# Patient Record
Sex: Male | Born: 1985 | Race: White | Hispanic: No | Marital: Single | Smoking: Current every day smoker
Health system: Southern US, Community
[De-identification: ages and names within clinical notes are randomized; demographics above are authoritative.]

## PROBLEM LIST (undated history)

## (undated) ENCOUNTER — Emergency Department (HOSPITAL_COMMUNITY): Admission: EM | Payer: Medicaid Other | Source: Home / Self Care

## (undated) DIAGNOSIS — I2510 Atherosclerotic heart disease of native coronary artery without angina pectoris: Secondary | ICD-10-CM

## (undated) DIAGNOSIS — F1910 Other psychoactive substance abuse, uncomplicated: Secondary | ICD-10-CM

## (undated) DIAGNOSIS — Z951 Presence of aortocoronary bypass graft: Secondary | ICD-10-CM

## (undated) HISTORY — PX: CARDIAC SURGERY: SHX584

---

## 2009-08-30 ENCOUNTER — Emergency Department (HOSPITAL_COMMUNITY): Admission: EM | Admit: 2009-08-30 | Discharge: 2009-08-31 | Payer: Self-pay | Admitting: Emergency Medicine

## 2010-09-06 LAB — APTT: aPTT: 32 seconds (ref 24–37)

## 2010-09-06 LAB — COMPREHENSIVE METABOLIC PANEL
Albumin: 4.2 g/dL (ref 3.5–5.2)
Alkaline Phosphatase: 48 U/L (ref 39–117)
BUN: 10 mg/dL (ref 6–23)
Calcium: 9.1 mg/dL (ref 8.4–10.5)
Chloride: 106 mEq/L (ref 96–112)
Creatinine, Ser: 0.8 mg/dL (ref 0.4–1.5)
Sodium: 138 mEq/L (ref 135–145)
Total Bilirubin: 0.6 mg/dL (ref 0.3–1.2)

## 2010-09-06 LAB — CBC
MCV: 87.4 fL (ref 78.0–100.0)
RBC: 5.03 MIL/uL (ref 4.22–5.81)
RDW: 11.4 % — ABNORMAL LOW (ref 11.5–15.5)
WBC: 8.4 10*3/uL (ref 4.0–10.5)

## 2010-09-06 LAB — POCT CARDIAC MARKERS
CKMB, poc: <1 ng/mL — ABNORMAL LOW (ref 1.0–8.0)
Myoglobin, poc: 50.7 ng/mL (ref 12–200)
Troponin i, poc: <0.05 ng/mL (ref 0.00–0.09)

## 2010-09-06 LAB — DIFFERENTIAL
Basophils Absolute: 0.1 10*3/uL (ref 0.0–0.1)
Eosinophils Absolute: 0.1 10*3/uL (ref 0.0–0.7)
Neutro Abs: 5.1 10*3/uL (ref 1.7–7.7)

## 2010-09-06 LAB — D-DIMER, QUANTITATIVE: D-Dimer, Quant: <0.22 ug/mL-FEU (ref 0.00–0.48)

## 2010-09-06 LAB — BRAIN NATRIURETIC PEPTIDE: Pro B Natriuretic peptide (BNP): <30 pg/mL (ref 0.0–100.0)

## 2015-10-07 ENCOUNTER — Encounter: Payer: Self-pay | Admitting: Internal Medicine

## 2015-12-02 ENCOUNTER — Ambulatory Visit: Payer: Self-pay | Admitting: Gastroenterology

## 2015-12-03 ENCOUNTER — Ambulatory Visit: Payer: Self-pay | Admitting: Internal Medicine

## 2016-03-02 ENCOUNTER — Encounter (HOSPITAL_COMMUNITY): Payer: Self-pay | Admitting: *Deleted

## 2016-03-02 ENCOUNTER — Emergency Department (HOSPITAL_COMMUNITY)
Admission: EM | Admit: 2016-03-02 | Discharge: 2016-03-02 | Disposition: A | Discharge status: Home / Self Care | Payer: Self-pay | Attending: Emergency Medicine | Admitting: Emergency Medicine

## 2016-03-02 DIAGNOSIS — Y929 Unspecified place or not applicable: Secondary | ICD-10-CM | POA: Insufficient documentation

## 2016-03-02 DIAGNOSIS — S0531XA Ocular laceration without prolapse or loss of intraocular tissue, right eye, initial encounter: Secondary | ICD-10-CM | POA: Insufficient documentation

## 2016-03-02 DIAGNOSIS — W2107XA Struck by softball, initial encounter: Secondary | ICD-10-CM | POA: Insufficient documentation

## 2016-03-02 DIAGNOSIS — Y9364 Activity, baseball: Secondary | ICD-10-CM | POA: Insufficient documentation

## 2016-03-02 DIAGNOSIS — Y999 Unspecified external cause status: Secondary | ICD-10-CM | POA: Insufficient documentation

## 2016-03-02 DIAGNOSIS — Z5321 Procedure and treatment not carried out due to patient leaving prior to being seen by health care provider: Secondary | ICD-10-CM | POA: Insufficient documentation

## 2016-03-02 NOTE — ED Notes (Signed)
Pt states that he has to go to work in an hour, pt states he is unable to stay to be seen by a doctor. Laceration to eye not bleeding at this time.

## 2016-03-02 NOTE — ED Triage Notes (Signed)
Pt was playing softball around 2200 when he a softball ricochet off the bat hitting his right eye. Pt states the ball hit his glasses first then his eye. Pt presents with a laceration above right eye, below right eye brow. Pt denies LOC.

## 2018-01-31 ENCOUNTER — Encounter (HOSPITAL_COMMUNITY): Payer: Self-pay | Admitting: Emergency Medicine

## 2018-01-31 ENCOUNTER — Emergency Department (HOSPITAL_COMMUNITY)
Admission: EM | Admit: 2018-01-31 | Discharge: 2018-01-31 | Disposition: A | Discharge status: Other Acute Inpatient Hospital | Payer: Medicaid Other | Attending: Emergency Medicine | Admitting: Emergency Medicine

## 2018-01-31 ENCOUNTER — Encounter (HOSPITAL_COMMUNITY): Payer: Self-pay

## 2018-01-31 ENCOUNTER — Inpatient Hospital Stay (HOSPITAL_COMMUNITY)
Admission: AD | Admit: 2018-01-31 | Discharge: 2018-02-03 | DRG: 897 | Disposition: A | Discharge status: Home / Self Care | Payer: Medicaid Other | Source: Intra-hospital | Attending: Psychiatry | Admitting: Psychiatry

## 2018-01-31 ENCOUNTER — Other Ambulatory Visit: Payer: Self-pay

## 2018-01-31 DIAGNOSIS — F329 Major depressive disorder, single episode, unspecified: Secondary | ICD-10-CM | POA: Diagnosis present

## 2018-01-31 DIAGNOSIS — F1920 Other psychoactive substance dependence, uncomplicated: Secondary | ICD-10-CM | POA: Diagnosis not present

## 2018-01-31 DIAGNOSIS — F1123 Opioid dependence with withdrawal: Secondary | ICD-10-CM | POA: Diagnosis present

## 2018-01-31 DIAGNOSIS — Z046 Encounter for general psychiatric examination, requested by authority: Secondary | ICD-10-CM | POA: Insufficient documentation

## 2018-01-31 DIAGNOSIS — F332 Major depressive disorder, recurrent severe without psychotic features: Secondary | ICD-10-CM | POA: Diagnosis present

## 2018-01-31 DIAGNOSIS — I2510 Atherosclerotic heart disease of native coronary artery without angina pectoris: Secondary | ICD-10-CM | POA: Diagnosis present

## 2018-01-31 DIAGNOSIS — F10230 Alcohol dependence with withdrawal, uncomplicated: Secondary | ICD-10-CM | POA: Diagnosis present

## 2018-01-31 DIAGNOSIS — F1120 Opioid dependence, uncomplicated: Secondary | ICD-10-CM

## 2018-01-31 DIAGNOSIS — Z951 Presence of aortocoronary bypass graft: Secondary | ICD-10-CM

## 2018-01-31 DIAGNOSIS — G4700 Insomnia, unspecified: Secondary | ICD-10-CM | POA: Diagnosis present

## 2018-01-31 DIAGNOSIS — F1124 Opioid dependence with opioid-induced mood disorder: Secondary | ICD-10-CM | POA: Diagnosis present

## 2018-01-31 DIAGNOSIS — F1910 Other psychoactive substance abuse, uncomplicated: Secondary | ICD-10-CM | POA: Diagnosis not present

## 2018-01-31 DIAGNOSIS — F1024 Alcohol dependence with alcohol-induced mood disorder: Secondary | ICD-10-CM | POA: Diagnosis present

## 2018-01-31 DIAGNOSIS — F419 Anxiety disorder, unspecified: Secondary | ICD-10-CM | POA: Diagnosis not present

## 2018-01-31 DIAGNOSIS — F17220 Nicotine dependence, chewing tobacco, uncomplicated: Secondary | ICD-10-CM | POA: Insufficient documentation

## 2018-01-31 DIAGNOSIS — Z590 Homelessness: Secondary | ICD-10-CM | POA: Diagnosis not present

## 2018-01-31 DIAGNOSIS — F32A Depression, unspecified: Secondary | ICD-10-CM

## 2018-01-31 DIAGNOSIS — I252 Old myocardial infarction: Secondary | ICD-10-CM | POA: Diagnosis not present

## 2018-01-31 DIAGNOSIS — F17210 Nicotine dependence, cigarettes, uncomplicated: Secondary | ICD-10-CM | POA: Diagnosis present

## 2018-01-31 HISTORY — DX: Atherosclerotic heart disease of native coronary artery without angina pectoris: I25.10

## 2018-01-31 LAB — CBC
HCT: 47.6 % (ref 39.0–52.0)
Hemoglobin: 15.4 g/dL (ref 13.0–17.0)
MCH: 29.1 pg (ref 26.0–34.0)
MCHC: 32.4 g/dL (ref 30.0–36.0)
MCV: 90 fL (ref 78.0–100.0)
PLATELETS: 302 10*3/uL (ref 150–400)
RBC: 5.29 MIL/uL (ref 4.22–5.81)
RDW: 11.9 % (ref 11.5–15.5)
WBC: 9.3 10*3/uL (ref 4.0–10.5)

## 2018-01-31 LAB — COMPREHENSIVE METABOLIC PANEL
ALBUMIN: 4.5 g/dL (ref 3.5–5.0)
ALK PHOS: 47 U/L (ref 38–126)
ALT: 20 U/L (ref 0–44)
ANION GAP: 8 (ref 5–15)
AST: 19 U/L (ref 15–41)
BILIRUBIN TOTAL: 0.7 mg/dL (ref 0.3–1.2)
BUN: 11 mg/dL (ref 6–20)
CALCIUM: 9.8 mg/dL (ref 8.9–10.3)
CO2: 30 mmol/L (ref 22–32)
Chloride: 103 mmol/L (ref 98–111)
Creatinine, Ser: 0.91 mg/dL (ref 0.61–1.24)
GFR calc Af Amer: >60 mL/min (ref >60)
GFR calc non Af Amer: >60 mL/min (ref >60)
Glucose, Bld: 103 mg/dL — ABNORMAL HIGH (ref 70–99)
POTASSIUM: 4.1 mmol/L (ref 3.5–5.1)
SODIUM: 141 mmol/L (ref 135–145)
TOTAL PROTEIN: 7.3 g/dL (ref 6.5–8.1)

## 2018-01-31 LAB — RAPID URINE DRUG SCREEN, HOSP PERFORMED
AMPHETAMINES: NOT DETECTED
BENZODIAZEPINES: NOT DETECTED
Barbiturates: NOT DETECTED
COCAINE: NOT DETECTED
Opiates: POSITIVE — AB
TETRAHYDROCANNABINOL: POSITIVE — AB

## 2018-01-31 LAB — ETHANOL: Alcohol, Ethyl (B): <10 mg/dL (ref <10)

## 2018-01-31 MED ORDER — VITAMIN B-1 100 MG PO TABS
100.0000 mg | ORAL_TABLET | Freq: Every day | ORAL | Status: DC
  Administered 2018-02-01 – 2018-02-02 (×2): 100 mg via ORAL
  Filled 2018-01-31 (×6): qty 1

## 2018-01-31 MED ORDER — METOPROLOL TARTRATE 25 MG PO TABS
37.5000 mg | ORAL_TABLET | Freq: Two times a day (BID) | ORAL | Status: DC
  Administered 2018-01-31: 37.5 mg via ORAL
  Filled 2018-01-31: qty 2

## 2018-01-31 MED ORDER — THIAMINE HCL 100 MG/ML IJ SOLN: 100.0000 mg | Freq: Once | INTRAMUSCULAR | Status: DC

## 2018-01-31 MED ORDER — ONDANSETRON 4 MG PO TBDP: 4.0000 mg | ORAL_TABLET | Freq: Four times a day (QID) | ORAL | Status: DC | PRN

## 2018-01-31 MED ORDER — LORAZEPAM 1 MG PO TABS
1.0000 mg | ORAL_TABLET | Freq: Four times a day (QID) | ORAL | Status: DC | PRN
  Administered 2018-01-31 – 2018-02-01 (×3): 1 mg via ORAL
  Filled 2018-01-31 (×3): qty 1

## 2018-01-31 MED ORDER — METOPROLOL TARTRATE 25 MG PO TABS
37.5000 mg | ORAL_TABLET | ORAL | Status: DC
  Administered 2018-02-01 – 2018-02-03 (×5): 37.5 mg via ORAL
  Filled 2018-01-31 (×11): qty 1

## 2018-01-31 MED ORDER — IBUPROFEN 400 MG PO TABS: 400.0000 mg | ORAL_TABLET | Freq: Four times a day (QID) | ORAL | Status: DC | PRN

## 2018-01-31 MED ORDER — ATORVASTATIN CALCIUM 40 MG PO TABS
40.0000 mg | ORAL_TABLET | Freq: Every day | ORAL | Status: DC
  Administered 2018-01-31: 40 mg via ORAL
  Filled 2018-01-31: qty 1

## 2018-01-31 MED ORDER — LOPERAMIDE HCL 2 MG PO CAPS: 2.0000 mg | ORAL_CAPSULE | ORAL | Status: DC | PRN

## 2018-01-31 MED ORDER — IBUPROFEN 400 MG PO TABS
400.0000 mg | ORAL_TABLET | Freq: Four times a day (QID) | ORAL | Status: DC | PRN
  Administered 2018-02-01: 400 mg via ORAL
  Filled 2018-01-31: qty 1

## 2018-01-31 MED ORDER — ADULT MULTIVITAMIN W/MINERALS CH
1.0000 | ORAL_TABLET | Freq: Every day | ORAL | Status: DC
  Administered 2018-02-01 – 2018-02-02 (×2): 1 via ORAL
  Filled 2018-01-31 (×7): qty 1

## 2018-01-31 MED ORDER — ACETAMINOPHEN 500 MG PO TABS: 500.0000 mg | ORAL_TABLET | Freq: Four times a day (QID) | ORAL | Status: DC | PRN

## 2018-01-31 MED ORDER — MAGNESIUM HYDROXIDE 400 MG/5ML PO SUSP: 30.0000 mL | Freq: Every day | ORAL | Status: DC | PRN

## 2018-01-31 MED ORDER — NICOTINE 21 MG/24HR TD PT24
21.0000 mg | MEDICATED_PATCH | Freq: Every day | TRANSDERMAL | Status: DC
  Administered 2018-01-31 – 2018-02-02 (×3): 21 mg via TRANSDERMAL
  Filled 2018-01-31 (×7): qty 1

## 2018-01-31 MED ORDER — SERTRALINE HCL 50 MG PO TABS
50.0000 mg | ORAL_TABLET | Freq: Every day | ORAL | Status: DC
  Administered 2018-01-31: 50 mg via ORAL
  Filled 2018-01-31: qty 1

## 2018-01-31 MED ORDER — LORAZEPAM 1 MG PO TABS
1.0000 mg | ORAL_TABLET | Freq: Once | ORAL | Status: AC
  Administered 2018-01-31: 1 mg via ORAL
  Filled 2018-01-31: qty 1

## 2018-01-31 MED ORDER — NITROGLYCERIN 0.4 MG SL SUBL: 0.4000 mg | SUBLINGUAL_TABLET | SUBLINGUAL | Status: DC | PRN

## 2018-01-31 MED ORDER — ACETAMINOPHEN 325 MG PO TABS: 650.0000 mg | ORAL_TABLET | Freq: Four times a day (QID) | ORAL | Status: DC | PRN

## 2018-01-31 MED ORDER — HYDROXYZINE HCL 25 MG PO TABS
25.0000 mg | ORAL_TABLET | Freq: Four times a day (QID) | ORAL | Status: DC | PRN
  Administered 2018-01-31 (×2): 25 mg via ORAL
  Filled 2018-01-31 (×2): qty 1

## 2018-01-31 MED ORDER — ASPIRIN EC 325 MG PO TBEC
325.0000 mg | DELAYED_RELEASE_TABLET | Freq: Every day | ORAL | Status: DC
  Administered 2018-01-31: 325 mg via ORAL
  Filled 2018-01-31: qty 1

## 2018-01-31 MED ORDER — ASPIRIN 325 MG PO TABS
325.0000 mg | ORAL_TABLET | Freq: Every day | ORAL | Status: DC
  Administered 2018-02-01 – 2018-02-02 (×2): 325 mg via ORAL
  Filled 2018-01-31 (×7): qty 1

## 2018-01-31 MED ORDER — ALUM & MAG HYDROXIDE-SIMETH 200-200-20 MG/5ML PO SUSP: 30.0000 mL | ORAL | Status: DC | PRN

## 2018-01-31 MED ORDER — ATORVASTATIN CALCIUM 40 MG PO TABS
40.0000 mg | ORAL_TABLET | Freq: Every day | ORAL | Status: DC
  Administered 2018-02-01 – 2018-02-03 (×3): 40 mg via ORAL
  Filled 2018-01-31 (×6): qty 1

## 2018-01-31 NOTE — Progress Notes (Signed)
Patient attended NA meeting and participated.  

## 2018-01-31 NOTE — ED Notes (Signed)
Breakfast tray ordered 

## 2018-01-31 NOTE — ED Notes (Signed)
Pt to have inpatient treatment. Pt made aware and is extremely happy with the plan.

## 2018-01-31 NOTE — ED Notes (Signed)
Patient given blue paper scrubs at triage /security notified to wand pt.

## 2018-01-31 NOTE — ED Notes (Signed)
[facility]. All belongings given to [facility] trans port. Pt stable with steady gait.

## 2018-01-31 NOTE — ED Notes (Addendum)
Pt states anxiety improved. Request to wear Underarmor shirt un derscrubs as comfort for recent surgery. Same done after discussed with PA

## 2018-01-31 NOTE — ED Notes (Signed)
TTS in progress 

## 2018-01-31 NOTE — BH Assessment (Signed)
Tele Assessment Note   Patient Name: Derek Little MRN: [id number] Referring Physician: Dr. Frederick Peers, MD Location of Patient: [facility] Location of Provider: [facility]  Derek Little is a 32 y.o. male who presents to the [facility] depressed asking for assistance getting off of heroin. Pt shares he had a heart attack approximately two weeks ago he was placed on medication that was very similar to medication. He states that he became extremely depressed while on the medication, resulting on him drinking excessively to fight the depression, which then led to him using the heroin when he was unable to get more prescription percocet.  Pt shares he has been feeling that he "can't go on" and that if he doesn't stop, he doesn't know what he'll do. Pt denies a plan at this time. He denies AVH, HI, or NSSIB. He denies any invent or seeing a therapist or a psychiatrist. Pt denies having a therapist or psychiatrist in the past or presently.  Pt denies any history of familial abuse. He shares his grandparents may have had alcoholism. He denies access to weapons. Pt shares his grandparents may have his guns in their home for safety. Pt states his legs have been jerking in his sleep. He states his appetite has been almost non-existent.   Pt is oriented x4. His remote and recent memory is intact. Pt is cooperative, yet tearful, throughout the assessment. Pt's insight, judgement, and impulse control is impaired at this time.  Derek Conn NP reviewed pt's chart and information and determined that pt meets criteria for inpatient hospitalization. Pts placement is pending at this time.   Diagnosis: F33.2, Major depressive disorder, Recurrent episode, Severe   Past Medical History:  Past Medical History:  Diagnosis Date  . CAD (coronary artery disease)     Past Surgical History:  Procedure Laterality Date  . CARDIAC SURGERY      Family History: No family history on  file.  Social History:  reports that he has been smoking. He has quit using smokeless tobacco.  His smokeless tobacco use included snuff. He reports that he drinks alcohol. He reports that he has current or past drug history. Drug: Marijuana.  Additional Social History:  Alcohol / Drug Use Pain Medications: Please see MAR Prescriptions: Please see MAR Over the Counter: Please see MAR History of alcohol / drug use?: Yes Longest period of sobriety (when/how long): 10 years (almost 11 years) Substance #1 Name of Substance 1: Heroin 1 - Age of First Use: 13 1 - Amount (size/oz): Unknown (states uses "5-10 bags" daily) 1 - Frequency: Daily 1 - Duration: 4-5 weeks 1 - Last Use / Amount: Yesterday (01/30/18) Substance #2 Name of Substance 2: EtOH 2 - Age of First Use: Unknown 2 - Amount (size/oz): 1/4 gallon 2 - Frequency: Daily 2 - Duration: Unknown 2 - Last Use / Amount: Yesterday (01/30/09)  CIWA: CIWA-Ar BP: 116/88 Pulse Rate: 83 COWS: Clinical Opiate Withdrawal Scale (COWS) Resting Pulse Rate: Pulse Rate 80 or below Sweating: No report of chills or flushing Restlessness: Able to sit still Pupil Size: Pupils pinned or normal size for room light Bone or Joint Aches: Not present Runny Nose or Tearing: Not present GI Upset: No GI symptoms Tremor: No tremor Yawning: No yawning Anxiety or Irritability: Patient reports increasing irritability or anxiousness Gooseflesh Skin: Skin is smooth COWS Total Score: 1  Allergies: No Known Allergies  Home Medications:  (Not in a hospital admission)  OB/GYN Status:  No LMP  for male patient.  General Assessment Data [facility] Assessment: In system Is this a Tele or Face-to-Face Assessment?: Tele Assessment Is this an Initial Assessment or a Re-assessment for this encounter?: Initial Assessment Marital status: Other (comment)(Pt & his fiance may have split up, he's not sure) Derek Little Is patient  pregnant?: No Pregnancy Status: No Living Arrangements: Alone Can pt return to current living arrangement?: Yes Admission Status: Voluntary Is patient capable of signing voluntary admission?: Yes Referral Source: Self/Family/Friend Insurance type: Medicaid     Crisis Care Plan Living Arrangements: Alone Legal Guardian: (N/A) Name of Psychiatrist: None Name of Therapist: None  Education Status Is patient currently in school?: No Is the patient employed, unemployed or receiving disability?: Unemployed  Risk to self with the past 6 months Suicidal Ideation: Yes-Currently Present Has patient been a risk to self within the past 6 months prior to admission? : Yes Suicidal Intent: No Has patient had any suicidal intent within the past 6 months prior to admission? : No Is patient at risk for suicide?: Yes Suicidal Plan?: No Has patient had any suicidal plan within the past 6 months prior to admission? : No Access to Means: No What has been your use of drugs/alcohol within the last 12 months?: Pt has been using heroin and EtOH Previous Attempts/Gestures: No How many times?: 0 Other Self Harm Risks: Pt states he "can't do this anymore" Triggers for Past Attempts: None known Intentional Self Injurious Behavior: None Family Suicide History: No Recent stressful life event(s): Conflict (Comment), Turmoil (Comment), Loss (Comment)(Pt relapsed, had heart attach, lost home due to lack of work) Persecutory voices/beliefs?: No Depression: Yes Depression Symptoms: Insomnia, Tearfulness, Fatigue, Guilt, Loss of interest in usual pleasures, Feeling worthless/self pity, Despondent Substance abuse history and/or treatment for substance abuse?: Yes Suicide prevention information given to non-admitted patients: Not applicable  Risk to Others within the past 6 months Homicidal Ideation: No Does patient have any lifetime risk of violence toward others beyond the six months prior to admission? :  No Thoughts of Harm to Others: No Current Homicidal Intent: No Current Homicidal Plan: No Access to Homicidal Means: No Identified Victim: None noted History of harm to others?: No Assessment of Violence: On admission Violent Behavior Description: None noted Does patient have access to weapons?: No(Pt denied)  Psychosis Hallucinations: None noted Delusions: None noted  Mental Status Report Appearance/Hygiene: In scrubs Eye Contact: Fair Motor Activity: (Pt was sitting in bed) Speech: Logical/coherent Level of Consciousness: Crying, Alert Mood: Depressed, Worthless, low self-esteem Affect: Sad, Sullen, Depressed Anxiety Level: Moderate Thought Processes: Coherent, Relevant Judgement: Unimpaired Orientation: Person, Place, Time, Situation Obsessive Compulsive Thoughts/Behaviors: None  Cognitive Functioning Concentration: Normal Memory: Recent Intact, Remote Intact Is patient IDD: No Is patient DD?: No Insight: Fair Impulse Control: Poor Appetite: Poor Have you had any weight changes? : No Change Sleep: Decreased Total Hours of Sleep: 2 Vegetative Symptoms: None  ADLScreening [facility] Assessment Services) Patient's cognitive ability adequate to safely complete daily activities?: Yes Patient able to express need for assistance with ADLs?: Yes Independently performs ADLs?: Yes (appropriate for developmental age)  Prior Inpatient Therapy Prior Inpatient Therapy: No  Prior Outpatient Therapy Prior Outpatient Therapy: No Does patient have an ACCT team?: No Does patient have Intensive In-House Services?  : No Does patient have [facility] services? : No Does patient have P4CC services?: No  ADL Screening (condition at time of admission) Patient's cognitive ability adequate to safely complete daily activities?: Yes Is  the patient deaf or have difficulty hearing?: No Does the patient have difficulty seeing, even when wearing glasses/contacts?: No Does the patient have  difficulty concentrating, remembering, or making decisions?: No Patient able to express need for assistance with ADLs?: Yes Does the patient have difficulty dressing or bathing?: No Independently performs ADLs?: Yes (appropriate for developmental age) Does the patient have difficulty walking or climbing stairs?: No Weakness of Legs: None Weakness of Arms/Hands: None     Therapy Consults (therapy consults require a physician order) PT Evaluation Needed: No OT Evalulation Needed: No SLP Evaluation Needed: No Abuse/Neglect Assessment (Assessment to be complete while patient is alone) Abuse/Neglect Assessment Can Be Completed: Yes Physical Abuse: Denies Verbal Abuse: Denies Sexual Abuse: Denies Exploitation of patient/patient's resources: Denies Self-Neglect: Denies Values / Beliefs Cultural Requests During Hospitalization: None Spiritual Requests During Hospitalization: None Consults Spiritual Care Consult Needed: No Social Work Consult Needed: No Merchant navy officerAdvance Directives (For Healthcare) Does Patient Have a Medical Advance Directive?: No Would patient like information on creating a medical advance directive?: No - Patient declined       Disposition: Derek ConnJason Berry NP reviewed pt's chart and information and determined that pt meets criteria for inpatient hospitalization. Pts placement is pending at this time. Disposition Initial Assessment Completed for this Encounter: Yes Patient referred to: Other (Comment)([facility])  This service was provided via telemedicine using a 2-way, interactive audio and video technology.  Names of all persons participating in this telemedicine service and their role in this encounter. Name: Derek Little Role: Patient  Name: Duard BradySamantha Sohan Potvin Role: Clinician    Ralph DowdySamantha L Marsalis Beaulieu 01/31/2018 6:24 AM

## 2018-01-31 NOTE — ED Notes (Signed)
Lunch tray at bedsdie. Pt declines to eat at this time. Offered to warm plate. Declined

## 2018-01-31 NOTE — ED Provider Notes (Signed)
11:08 AM Behavioral health team called to report that patient is going to be admitted for further inpatient management.  Patient was reassessed by me and he had no physical complaints.   Based on his lack of other complaints and work-up, I feel he is medically cleared for psychiatric management.   He will be started back on his home medication regimen and likely admitted this afternoon.   Lizzete Gough, Canary Brimhristopher J, MD 01/31/18 [phone]

## 2018-01-31 NOTE — Tx Team (Signed)
Initial Treatment Plan 01/31/2018 4:03 PM Derek NakayamaMichael Menta [id number]    PATIENT STRESSORS: Financial difficulties Health problems Substance abuse   PATIENT STRENGTHS: Communication skills General fund of knowledge Supportive family/friends   PATIENT IDENTIFIED PROBLEMS: Depression  Substance abuse  "Get of these drugs"                 DISCHARGE CRITERIA:  Improved stabilization in mood, thinking, and/or behavior Withdrawal symptoms are absent or subacute and managed without 24-hour nursing intervention  PRELIMINARY DISCHARGE PLAN: Outpatient therapy Medication management  PATIENT/FAMILY INVOLVEMENT: This treatment plan has been presented to and reviewed with the patient, Derek Little.  The patient and family have been given the opportunity to ask questions and make suggestions.  Levin BaconHeather V Malosi Hemstreet, RN 01/31/2018, 4:03 PM

## 2018-01-31 NOTE — ED Notes (Signed)
Pt intermittently tearful but redirects with information about process. Pt statyes he feels like he is "all over the place" but declines further medication.

## 2018-01-31 NOTE — ED Provider Notes (Signed)
[facility] Provider Note   CSN: [id number] Arrival date & time: 01/31/18  0007     History   Chief Complaint Chief Complaint  Patient presents with  . Addiction Problem    HPI Melrose NakayamaMichael Isham is a 32 y.o. male.  Patient presents for help with addiction. He used heroin from the age of 32 until 20 years and was clean for 10 years. He reports heart surgery recently (CABG) and being put on percocet post-op led to relapse into heavy alcohol use and heroin. He states he feels he has no options and is desperate for help. He denies SI but is very tearful and states "I can't live like this". No HI/AVH.   The history is provided by the patient. No language interpreter was used.    Past Medical History:  Diagnosis Date  . CAD (coronary artery disease)     There are no active problems to display for this patient.   Past Surgical History:  Procedure Laterality Date  . CARDIAC SURGERY          Home Medications    Prior to Admission medications   Not on File    Family History No family history on file.  Social History Social History   Tobacco Use  . Smoking status: Current Every Day Smoker  . Smokeless tobacco: Former NeurosurgeonUser    Types: Snuff  Substance Use Topics  . Alcohol use: Yes  . Drug use: Yes    Types: Marijuana    Comment: Heroin /Percocet     Allergies   Patient has no known allergies.   Review of Systems Review of Systems  Constitutional: Negative for chills and fever.  HENT: Negative.   Respiratory: Negative.  Negative for shortness of breath.   Cardiovascular: Negative.  Negative for chest pain.  Gastrointestinal: Negative.   Musculoskeletal: Negative.   Skin: Negative.   Neurological: Negative.   Psychiatric/Behavioral: Positive for dysphoric mood. The patient is nervous/anxious.        See HPI.     Physical Exam Updated Vital Signs BP 116/88 (BP Location: Right Arm)   Pulse 83   Temp 98.6 F (37  C) (Oral)   Resp 16   SpO2 100%   Physical Exam  Constitutional: He is oriented to person, place, and time. He appears well-developed and well-nourished.  HENT:  Head: Normocephalic.  Neck: Normal range of motion. Neck supple.  Cardiovascular: Normal rate and regular rhythm.  Pulmonary/Chest: Effort normal and breath sounds normal. He has no wheezes. He has no rales.  Well healed surgical scar anterior chest.   Abdominal: Soft. Bowel sounds are normal. There is no tenderness. There is no rebound and no guarding.  Musculoskeletal: Normal range of motion.  Neurological: He is alert and oriented to person, place, and time.  Skin: Skin is warm and dry. No rash noted.  Psychiatric: His behavior is normal. His mood appears anxious. He is not actively hallucinating. He exhibits a depressed mood.  Patient is tearful, anxious.      ED Treatments / Results  Labs (all labs ordered are listed, but only abnormal results are displayed) Labs Reviewed  COMPREHENSIVE METABOLIC PANEL - Abnormal; Notable for the following components:      Result Value   Glucose, Bld 103 (*)    All other components within normal limits  ETHANOL  CBC  RAPID URINE DRUG SCREEN, HOSP PERFORMED    EKG None  Radiology No results found.  Procedures Procedures (  including critical care time)  Medications Ordered in ED Medications - No data to display   Initial Impression / Assessment and Plan / ED Course  I have reviewed the triage vital signs and the nursing notes.  Pertinent labs & imaging results that were available during my care of the patient were reviewed by me and considered in my medical decision making (see chart for details).     Patient with a history of heroin abuse many years ago, clean for 10 years then relapsed after recent heart surgery and use of Percocet for post-op pain. He reports heavy alcohol use as well, up to a 5th a day. No history of withdrawal seizures or DT's.   TTS  consultation requested as patient is depressed, making soft suicidal statements "I can't like like this".   Final Clinical Impressions(s) / ED Diagnoses   Final diagnoses:  None   1. Polysubstance abuse 2. Depression   ED Discharge Orders    None       Elpidio AnisUpstill, Yisel Megill, Cordelia Poche-C 01/31/18 0242    Little, Ambrose Finlandachel Morgan, MD 01/31/18 [phone]

## 2018-01-31 NOTE — Discharge Instructions (Signed)
Transfer to [facility] °

## 2018-01-31 NOTE — ED Notes (Addendum)
Patient denies pain, but is very stressed that he won't get help for the heroin. States that he had heart surgery and was started on percocet which lead to heroin.  States that he has been doing it for 3 weeks. Pt in tears. Sandwich bag and drink given to patient. Pt states that he cannot live like this. Speaking with pt until MD comes to bedside.

## 2018-01-31 NOTE — ED Notes (Signed)
[facility] contacted fro transport. [facility] contacted and most recent vs and update given.

## 2018-01-31 NOTE — ED Notes (Signed)
Pt's belongings in locker #2, not yet inventoried.

## 2018-01-31 NOTE — ED Notes (Addendum)
Pt requesting medication for anxiety. Dr. Rush Landmarkegeler aware.

## 2018-01-31 NOTE — Progress Notes (Signed)
Derek Little is a 32 year old male being admitted voluntarily to 306-2 from [facility].  He came in for help with his heroin addiction.  He reported increasing depression, drinking and using heroin when he couldn't get refill of Percocet.  He was recently had a heart attack and has diagnoses of coronary artery disease.  [facility], he was pleasant and cooperative.  He denied SI/HI or A/V hallucinations.  He stated that his recent alcohol/substance abuse, open heart surgery and inability to work are his stressors.  Oriented him to the unit.  Admission paperwork completed and signed.  Belongings searched and secured in locker # 46, no contraband found.  Skin assessment completed and noted midline scar on chest from recent open heart surgery.  Q 15 minute checks initiated for safety.  We will continue to monitor the progress towards his goals.

## 2018-01-31 NOTE — ED Notes (Addendum)
Pt talking on phone with family after phone numbers retrieved fromn belongings.

## 2018-01-31 NOTE — ED Triage Notes (Signed)
Patient requesting psychiatric treatment/Detox for his Heroin addiction and anxiety , denies suicidal ideation / no hallucinations.

## 2018-01-31 NOTE — ED Notes (Signed)
ED Provider at bedside. 

## 2018-01-31 NOTE — Progress Notes (Signed)
[facility] Bed [facility] Greer EeArchna Kumar, MD, is the accepting provider.  Nehemiah MassedFernando Cobos, MD is the attending provider.  Call report to [phone]  [facility] notified.   Pt is Voluntary.  Pt may be transported by [facility]  [facility]30  Carney BernJean T. Kaylyn LimSutter, MSW, LCSWA Disposition Clinical Social Work [phone] (cell) [phone] (office)

## 2018-02-01 DIAGNOSIS — F419 Anxiety disorder, unspecified: Secondary | ICD-10-CM

## 2018-02-01 DIAGNOSIS — F1124 Opioid dependence with opioid-induced mood disorder: Principal | ICD-10-CM

## 2018-02-01 DIAGNOSIS — F17210 Nicotine dependence, cigarettes, uncomplicated: Secondary | ICD-10-CM

## 2018-02-01 DIAGNOSIS — F1024 Alcohol dependence with alcohol-induced mood disorder: Secondary | ICD-10-CM

## 2018-02-01 DIAGNOSIS — F332 Major depressive disorder, recurrent severe without psychotic features: Secondary | ICD-10-CM

## 2018-02-01 DIAGNOSIS — F1120 Opioid dependence, uncomplicated: Secondary | ICD-10-CM

## 2018-02-01 DIAGNOSIS — F1920 Other psychoactive substance dependence, uncomplicated: Secondary | ICD-10-CM

## 2018-02-01 DIAGNOSIS — G4700 Insomnia, unspecified: Secondary | ICD-10-CM

## 2018-02-01 MED ORDER — METHOCARBAMOL 500 MG PO TABS
500.0000 mg | ORAL_TABLET | Freq: Three times a day (TID) | ORAL | Status: DC | PRN
  Administered 2018-02-01 (×2): 500 mg via ORAL
  Filled 2018-02-01 (×2): qty 1

## 2018-02-01 MED ORDER — CHLORDIAZEPOXIDE HCL 25 MG PO CAPS
25.0000 mg | ORAL_CAPSULE | Freq: Every day | ORAL | Status: DC
  Filled 2018-02-01: qty 1

## 2018-02-01 MED ORDER — NAPROXEN 500 MG PO TABS
500.0000 mg | ORAL_TABLET | Freq: Two times a day (BID) | ORAL | Status: DC | PRN
  Administered 2018-02-01: 500 mg via ORAL
  Filled 2018-02-01: qty 1

## 2018-02-01 MED ORDER — CHLORDIAZEPOXIDE HCL 25 MG PO CAPS
25.0000 mg | ORAL_CAPSULE | Freq: Four times a day (QID) | ORAL | Status: AC
  Administered 2018-02-01 (×3): 25 mg via ORAL
  Filled 2018-02-01 (×3): qty 1

## 2018-02-01 MED ORDER — CHLORDIAZEPOXIDE HCL 25 MG PO CAPS
25.0000 mg | ORAL_CAPSULE | Freq: Four times a day (QID) | ORAL | Status: DC | PRN
  Administered 2018-02-02: 25 mg via ORAL
  Filled 2018-02-01 (×2): qty 1

## 2018-02-01 MED ORDER — LOPERAMIDE HCL 2 MG PO CAPS: 2.0000 mg | ORAL_CAPSULE | ORAL | Status: DC | PRN

## 2018-02-01 MED ORDER — HYDROXYZINE HCL 25 MG PO TABS: 25.0000 mg | ORAL_TABLET | Freq: Four times a day (QID) | ORAL | Status: DC | PRN

## 2018-02-01 MED ORDER — GABAPENTIN 300 MG PO CAPS
300.0000 mg | ORAL_CAPSULE | Freq: Three times a day (TID) | ORAL | Status: DC
  Administered 2018-02-01 – 2018-02-03 (×6): 300 mg via ORAL
  Filled 2018-02-01 (×12): qty 1

## 2018-02-01 MED ORDER — CHLORDIAZEPOXIDE HCL 25 MG PO CAPS
50.0000 mg | ORAL_CAPSULE | Freq: Once | ORAL | Status: AC
  Administered 2018-02-01: 50 mg via ORAL
  Filled 2018-02-01: qty 2

## 2018-02-01 MED ORDER — TRAZODONE HCL 100 MG PO TABS
100.0000 mg | ORAL_TABLET | Freq: Every evening | ORAL | Status: DC | PRN
  Administered 2018-02-01 – 2018-02-02 (×2): 100 mg via ORAL
  Filled 2018-02-01 (×2): qty 1

## 2018-02-01 MED ORDER — QUETIAPINE FUMARATE 25 MG PO TABS
25.0000 mg | ORAL_TABLET | Freq: Every day | ORAL | Status: DC
  Administered 2018-02-01 – 2018-02-02 (×2): 25 mg via ORAL
  Filled 2018-02-01 (×5): qty 1

## 2018-02-01 MED ORDER — HYDROXYZINE HCL 25 MG PO TABS
25.0000 mg | ORAL_TABLET | Freq: Four times a day (QID) | ORAL | Status: DC | PRN
  Administered 2018-02-01: 25 mg via ORAL
  Filled 2018-02-01: qty 1

## 2018-02-01 MED ORDER — ACETAMINOPHEN 500 MG PO TABS: 1000.0000 mg | ORAL_TABLET | Freq: Four times a day (QID) | ORAL | Status: DC | PRN

## 2018-02-01 MED ORDER — THIAMINE HCL 100 MG/ML IJ SOLN: 100.0000 mg | Freq: Once | INTRAMUSCULAR | Status: DC

## 2018-02-01 MED ORDER — ONDANSETRON 4 MG PO TBDP: 4.0000 mg | ORAL_TABLET | Freq: Four times a day (QID) | ORAL | Status: DC | PRN

## 2018-02-01 MED ORDER — DICYCLOMINE HCL 20 MG PO TABS: 20.0000 mg | ORAL_TABLET | Freq: Four times a day (QID) | ORAL | Status: DC | PRN

## 2018-02-01 MED ORDER — METHOCARBAMOL 500 MG PO TABS: 500.0000 mg | ORAL_TABLET | Freq: Four times a day (QID) | ORAL | Status: DC | PRN

## 2018-02-01 MED ORDER — ADULT MULTIVITAMIN W/MINERALS CH: 1.0000 | ORAL_TABLET | Freq: Every day | ORAL | Status: DC

## 2018-02-01 MED ORDER — CHLORDIAZEPOXIDE HCL 25 MG PO CAPS
25.0000 mg | ORAL_CAPSULE | Freq: Three times a day (TID) | ORAL | Status: AC
  Administered 2018-02-02 (×2): 25 mg via ORAL
  Filled 2018-02-01: qty 1

## 2018-02-01 MED ORDER — CHLORDIAZEPOXIDE HCL 25 MG PO CAPS
25.0000 mg | ORAL_CAPSULE | ORAL | Status: DC
  Administered 2018-02-03: 25 mg via ORAL

## 2018-02-01 MED ORDER — VITAMIN B-1 100 MG PO TABS: 100.0000 mg | ORAL_TABLET | Freq: Every day | ORAL | Status: DC

## 2018-02-01 NOTE — [facility] Group Notes (Signed)
LCSW Group Therapy Note   02/01/2018 1:15pm   Type of Therapy and Topic:  Group Therapy:  Overcoming Obstacles   Participation Level:  Did Not Attend   Description of Group:    In this group patients will be encouraged to explore what they see as obstacles to their own wellness and recovery. They will be guided to discuss their thoughts, feelings, and behaviors related to these obstacles. The group will process together ways to cope with barriers, with attention given to specific choices patients can make. Each patient will be challenged to identify changes they are motivated to make in order to overcome their obstacles. This group will be process-oriented, with patients participating in exploration of their own experiences as well as giving and receiving support and challenge from other group members.   Therapeutic Goals: 1. Patient will identify personal and current obstacles as they relate to admission. 2. Patient will identify barriers that currently interfere with their wellness or overcoming obstacles.  3. Patient will identify feelings, thought process and behaviors related to these barriers. 4. Patient will identify two changes they are willing to make to overcome these obstacles:      Summary of Patient Progress      Therapeutic Modalities:   Cognitive Behavioral Therapy Solution Focused Therapy Motivational Interviewing Relapse Prevention Therapy  Rona RavensHeather S Nashika Coker, LCSW 02/01/2018 1:03 PM

## 2018-02-01 NOTE — [facility] Suicide Risk Assessment (Signed)
[facility] Admission Suicide Risk Assessment   Nursing information obtained from:  Patient Demographic factors:  Male Current Mental Status:  NA Loss Factors:  Decline in physical health, Financial problems / change in socioeconomic status Historical Factors:  NA Risk Reduction Factors:  Living with another person, especially a relative  Total Time spent with patient: 45 minutes Principal Problem:  Opiate Use Disorder, Alcohol Use Disorder, Substance Induced Mood Disorder versus MDD   Diagnosis:   Patient Active Problem List   Diagnosis Date Noted  . MDD (major depressive disorder), recurrent severe, without psychosis (HCC) [F33.2] 01/31/2018   Subjective Data:   Continued Clinical Symptoms:  Alcohol Use Disorder Identification Test Final Score (AUDIT): 20 The "Alcohol Use Disorders Identification Test", Guidelines for Use in Primary Care, Second Edition.  World Science writer [facility]). Score between 0-7:  no or low risk or alcohol related problems. Score between 8-15:  moderate risk of alcohol related problems. Score between 16-19:  high risk of alcohol related problems. Score 20 or above:  warrants further diagnostic evaluation for alcohol dependence and treatment.   CLINICAL FACTORS:  32 year old male, presented to ED voluntarily requesting treatment for substance use disorder. Patient reports he had a recent MI/ CABG recently. After this he was started on percocet for management of pain. He reports that he unfortunately relapsed on illicit opiates and alcohol, and prior to admission was using heroin daily and drinking on most days of the week, at times up to a bottle of liquor per sitting .  Admission UDS positive for opiates and Cannabis, admission BAL negative.  He reports worsening anxiety and also endorses depression, which he attributes in part to feelings of guilt relating to relapse . He reports remote history of substance ( opiate ) abuse for several years, but states he had  been sober since his early 46s. He has been prescribed Zoloft for depression, anxiety.  At this time patient presents vaguely tremulous, not diaphoretic, not restless or agitated and vitals are stable -115/61, pulse 96. EKG NSR , QTc 442.   Dx- Opiate Use Disorder, Alcohol Use Disorder, Substance Induced Mood Disorder versus MDD .  Plan - Inpatient admission. Based on recent MI, will avoid Clonidine management and will address potential opiate WDL symptoms symptomatically as needed  .Has been started on standing alcohol detox protocol  ([facility]) . We have also reviewed importance of smoking cessation, particularly due to  history of CAD.      Musculoskeletal: Strength & Muscle Tone: within normal limits- minimal distal tremors, no overt restlessness or agitation Gait & Station: normal Patient leans: N/A  Psychiatric Specialty Exam: Physical Exam  ROS no headache, no chest pain or shortness of breath at present, no vomiting, does endorse nausea, chills. No fever .  Blood pressure 115/61, pulse 96, temperature 98.3 F (36.8 C), temperature source Oral, resp. rate 20, height 5\' 8"  (1.727 m), weight 77.6 kg.Body mass index is 26 kg/m.  General Appearance: Fairly Groomed  Eye Contact:  Good  Speech:  Normal Rate  Volume:  Normal  Mood:  depressed, anxious   Affect:  reactive, but tends to be labile, briefly tearful at times   Thought Process:  Linear and Descriptions of Associations: Intact  Orientation:  Full (Time, Place, and Person)  Thought Content:  no hallucinations, no delusions, not internally preoccupied   Suicidal Thoughts:  No denies suicidal or self injurious ideations, denies homicidal or violent ideations  Homicidal Thoughts:  No  Memory:  recent and  remote grossly intact   Judgement:  Fair  Insight:  Fair  Psychomotor Activity:  Normal  Concentration:  Concentration: Good and Attention Span: Good  Recall:  Good  Fund of Knowledge:  Good  Language:  Good   Akathisia:  Negative  Handed:  Right  AIMS (if indicated):     Assets:  Desire for Improvement Resilience  ADL's:  Intact  Cognition:  WNL  Sleep:  Number of Hours: 5.75      COGNITIVE FEATURES THAT CONTRIBUTE TO RISK:  Closed-mindedness and Loss of executive function    SUICIDE RISK:   Moderate:  Frequent suicidal ideation with limited intensity, and duration, some specificity in terms of plans, no associated intent, good self-control, limited dysphoria/symptomatology, some risk factors present, and identifiable protective factors, including available and accessible social support.  PLAN OF CARE: Patient will be admitted to inpatient psychiatric unit for stabilization and safety. Will provide and encourage milieu participation. Provide medication management and maked adjustments as needed. Will also provide medication management to minimize risk of WDL. Will follow daily.    I certify that inpatient services furnished can reasonably be expected to improve the patient's condition.   Craige CottaFernando A Maleny Candy, MD 02/01/2018, 11:01 AM

## 2018-02-01 NOTE — Progress Notes (Signed)
Patient ID: Melrose NakayamaMichael Sison, male   DOB: 02-03-1986, 32 y.o.   MRN: [id number]  Nursing Progress Note [id number]  Data: Patient presents with anxious mood and complaints of withdrawal symptoms. CIWA scores elevated this morning but have trended down with use of protocol medications. Patient provided but declined to complete their self-inventory sheet. Patient is seen visible in the milieu and deomonstrates appropriate behavior. Patient currently denies SI/HI/AVH.   Action: Patient educated about and provided medication per provider's orders. Patient safety maintained with q15 min safety checks and frequent rounding. Low fall risk precautions in place. Emotional support given. 1:1 interaction and active listening provided. Patient encouraged to attend meals and groups. Patient encouraged to work on treatment plan and goals. Labs, vital signs and patient behavior monitored throughout shift.   Response: Patient agrees to come to staff if any thoughts of SI/HI develop or if patient develops intention of acting on thoughts. Patient remains safe on the unit at this time. Patient is interacting with peers appropriately on the unit. Will continue to support and monitor.

## 2018-02-01 NOTE — Plan of Care (Signed)
  Problem: Education: Goal: Knowledge of [facility] Education information/materials will improve Outcome: Progressing   Problem: Activity: Goal: Interest or engagement in activities will improve Outcome: Progressing   Problem: Coping: Goal: Ability to demonstrate self-control will improve Outcome: Progressing   Problem: Health Behavior/Discharge Planning: Goal: Compliance with treatment plan for underlying cause of condition will improve Outcome: Progressing   Problem: Physical Regulation: Goal: Ability to maintain clinical measurements within normal limits will improve Outcome: Progressing   Problem: Safety: Goal: Periods of time without injury will increase Outcome: Progressing   

## 2018-02-01 NOTE — Progress Notes (Signed)
Pt is new to the unit this afternoon for detox off of opiates that he had been prescribed when he had open heart surgery.  He has a hx of heroin addiction and this was an issue for him.  He became very anxious about how he was feeling and went to the ED.  Pt has been in the bed all evening.  He was given Vistaril 25 mg at bedtime as he did not have a specific med ordered for sleep.  Pt denies SI/HI/AVH.  Support and encouragement offered.  Discharge plans are in process.  Safety maintained with q15 minute checks.

## 2018-02-01 NOTE — [facility] Counselor (Addendum)
CSW met with patient, completed PSA. Patient was tearful during most of the PSA. Patient indicated that housing for his family and him after he discharges from the hospital is his biggest need at the moment. CSW provided patient with list of housing options for patient and family, including [employer] [facility] for patient.    Netta Neat, MSW, LCSW Clinical Social Work

## 2018-02-01 NOTE — H&P (Addendum)
Psychiatric Admission Assessment Adult  Patient Identification: Derek Little  MRN:  [id number]  Date of Evaluation:  02/01/2018  Chief Complaint: "I need help with my addictions".    Principal Diagnosis: Opioid dependence with opioid-induced mood disorder ([place])  Diagnosis:   Patient Active Problem List   Diagnosis Date Noted  . Opioid dependence with opioid-induced mood disorder ([place]) [F11.24]     Priority: High  . Polysubstance (including opioids) dependence, daily use ([place]) [F19.20] 02/01/2018    Priority: Medium  . Alcohol dependence with alcohol-induced mood disorder ([place]) [F10.24]     Priority: Low  . MDD (major depressive disorder), recurrent severe, without psychosis ([place]) [F33.2] 01/31/2018   History of Present Illness: This is an admission assessment for this 32 year old AA male with hx of polysubstance use disorder including opioid drugs. He is being admitted to the [facility] from the [facility] ED with complaints of increased drug & alcohol use. He is requesting help with his addictions. Chart review indicated that patient had hx of drug use, was sober x 10 years, relapsed after a heart surgery after a heart attack last April, 2019, was prescribed Percocet which triggered his cravings for opioid use, he relapsed & has been abusing heroin, alcohol & weed x 4 months.  During this assessment, Derek Little reports tearfully, "I was dropped off at the [facility] ED on Tuesday (2 days ago). I was at the ED just over night. I need some medicines for withdrawal symptoms please. I'm nauseated & my whole body hurts. I'm trembling inside. I have not had any medicines since being here. I'm having withdrawal symptoms from opioid. I'm craving drugs badly. I feel like leaving this hospital now & go get high. I have been sniffing heroin & consuming a lot of alcohol x 4 months. I had drug problems from the age of 78 to 15. I did have some substance abuse treatment while living in [place].  I have been drug free x 10 years. So, in April of this year, without any warnings, I had a heart attack, had heart surgery (CABG), was prescribed Percocet tablets. I thought that I could handle taking opioids again. I was wrong. It triggered my addictions. And for 4 months, I have been sniffing heroin, drinking heavily & smoking pots. I'm not suicidal & no attempts".  Associated Signs/Symptoms:  Depression Symptoms:  depressed mood, insomnia, feelings of worthlessness/guilt, hopelessness, anxiety,  (Hypo) Manic Symptoms:  Impulsivity,  Anxiety Symptoms:  Excessive Worry,  Psychotic Symptoms:  Denies any hallucinations, delusions or paranoia.  PTSD Symptoms: Denies any PTSD symptoms or events.  Total Time spent with patient: 1 hour  Past Psychiatric History: Polysubstance use disorder.  Is the patient at risk to self? No.  Has the patient been a risk to self in the past 6 months? No.  Has the patient been a risk to self within the distant past? No.  Is the patient a risk to others? No.  Has the patient been a risk to others in the past 6 months? No.  Has the patient been a risk to others within the distant past? No.   Prior Inpatient Therapy: No Prior Outpatient Therapy: No  Alcohol Screening: 1. How often do you have a drink containing alcohol?: 4 or more times a week 2. How many drinks containing alcohol do you have on a typical day when you are drinking?: 10 or more 3. How often do you have six or more drinks on one occasion?:  Daily or almost daily AUDIT-C Score: 12 4. How often during the last year have you found that you were not able to stop drinking once you had started?: Daily or almost daily 5. How often during the last year have you failed to do what was normally expected from you becasue of drinking?: Never 6. How often during the last year have you needed a first drink in the morning to get yourself going after a heavy drinking session?: Never 7. How often during  the last year have you had a feeling of guilt of remorse after drinking?: Daily or almost daily 8. How often during the last year have you been unable to remember what happened the night before because you had been drinking?: Never 9. Have you or someone else been injured as a result of your drinking?: No 10. Has a relative or friend or a doctor or another health worker been concerned about your drinking or suggested you cut down?: No Alcohol Use Disorder Identification Test Final Score (AUDIT): 20 Intervention/Follow-up: Alcohol Education  Substance Abuse History in the last 12 months:  Yes.     Consequences of Substance Abuse: Medical Consequences:  Liver damage, Possible death by overdose Legal Consequences:  Arrests, jail time, Loss of driving privilege. Family Consequences:  Family discord, divorce and or separation.  Previous Psychotropic Medications: No   Psychological Evaluations: No   Past Medical History:  Past Medical History:  Diagnosis Date  . CAD (coronary artery disease)     Past Surgical History:  Procedure Laterality Date  . CARDIAC SURGERY     Family History: History reviewed. No pertinent family history.  Family Psychiatric  History: Denies any familial hx of mental illness or Substance use disorder.  Tobacco Screening: Have you used any form of tobacco in the last 30 days? (Cigarettes, Smokeless Tobacco, Cigars, and/or Pipes): Yes Tobacco use, Select all that apply: 5 or more cigarettes per day Are you interested in Tobacco Cessation Medications?: Yes, will notify MD for an order Counseled patient on smoking cessation including recognizing danger situations, developing coping skills and basic information about quitting provided: Refused/Declined practical counseling  Social History: Single, employed, lives with girlfriend & has 2 children. Smokes a pack of cigarette daily. Social History   Substance and Sexual Activity  Alcohol Use Yes     Social History    Substance and Sexual Activity  Drug Use Yes  . Types: Marijuana   Comment: Heroin /Percocet    Additional Social History:   Allergies:  No Known Allergies  Lab Results:  Results for orders placed or performed during the hospital encounter of 01/31/18 (from the past 48 hour(s))  Comprehensive metabolic panel     Status: Abnormal   Collection Time: 01/31/18 12:23 AM  Result Value Ref Range   Sodium 141 135 - 145 mmol/L   Potassium 4.1 3.5 - 5.1 mmol/L   Chloride 103 98 - 111 mmol/L   CO2 30 22 - 32 mmol/L   Glucose, Bld 103 (H) 70 - 99 mg/dL   BUN 11 6 - 20 mg/dL   Creatinine, Ser 0.91 0.61 - 1.24 mg/dL   Calcium 9.8 8.9 - 10.3 mg/dL   Total Protein 7.3 6.5 - 8.1 g/dL   Albumin 4.5 3.5 - 5.0 g/dL   AST 19 15 - 41 U/L   ALT 20 0 - 44 U/L   Alkaline Phosphatase 47 38 - 126 U/L   Total Bilirubin 0.7 0.3 - 1.2 mg/dL   GFR calc  non Af Amer >60 >60 mL/min   GFR calc Af Amer >60 >60 mL/min    Comment: (NOTE) The eGFR has been calculated using the CKD EPI equation. This calculation has not been validated in all clinical situations. eGFR's persistently <60 mL/min signify possible Chronic Kidney Disease.    Anion gap 8 5 - 15    Comment: Performed at [facility] [address], [place], [place] [address]  Ethanol     Status: None   Collection Time: 01/31/18 12:23 AM  Result Value Ref Range   Alcohol, Ethyl (B) <10 <10 mg/dL    Comment: (NOTE) Lowest detectable limit for serum alcohol is 10 mg/dL. For medical purposes only. Performed at [facility], [place] [address], [place] [address]   cbc     Status: None   Collection Time: 01/31/18 12:23 AM  Result Value Ref Range   WBC 9.3 4.0 - 10.5 K/uL   RBC 5.29 4.22 - 5.81 MIL/uL   Hemoglobin 15.4 13.0 - 17.0 g/dL   HCT 47.6 39.0 - 52.0 %   MCV 90.0 78.0 - 100.0 fL   MCH 29.1 26.0 - 34.0 pg   MCHC 32.4 30.0 - 36.0 g/dL   RDW 11.9 11.5 - 15.5 %   Platelets 302 150 - 400 K/uL    Comment: Performed at  [facility], [place] [address], [place], [place] [address]  Rapid urine drug screen (hospital performed)     Status: Abnormal   Collection Time: 01/31/18  2:23 AM  Result Value Ref Range   Opiates POSITIVE (A) NONE DETECTED   Cocaine NONE DETECTED NONE DETECTED   Benzodiazepines NONE DETECTED NONE DETECTED   Amphetamines NONE DETECTED NONE DETECTED   Tetrahydrocannabinol POSITIVE (A) NONE DETECTED   Barbiturates NONE DETECTED NONE DETECTED    Comment: (NOTE) DRUG SCREEN FOR MEDICAL PURPOSES ONLY.  IF CONFIRMATION IS NEEDED FOR ANY PURPOSE, NOTIFY LAB WITHIN 5 DAYS. LOWEST DETECTABLE LIMITS FOR URINE DRUG SCREEN Drug Class                     Cutoff (ng/mL) Amphetamine and metabolites    1000 Barbiturate and metabolites    200 Benzodiazepine                 378 Tricyclics and metabolites     300 Opiates and metabolites        300 Cocaine and metabolites        300 THC                            50 Performed at [facility], [place] [address], [place], [place] [address]    Blood Alcohol level:  Lab Results  Component Value Date   ETH <10 27/74/1287   Metabolic Disorder Labs:  No results found for: HGBA1C, MPG No results found for: PROLACTIN No results found for: CHOL, TRIG, HDL, CHOLHDL, VLDL, LDLCALC  Current Medications: Current Facility-Administered Medications  Medication Dose Route Frequency Provider Last Rate Last Dose  . acetaminophen (TYLENOL) tablet 1,000 mg  1,000 mg Oral Q6H PRN Lindell Spar I, NP      . alum & mag hydroxide-simeth (MAALOX/MYLANTA) 200-200-20 MG/5ML suspension 30 mL  30 mL Oral Q4H PRN Hampton Abbot, MD      . aspirin tablet 325 mg  325 mg Oral Daily Rankin, Shuvon B, NP   325 mg at 02/01/18  0802  . atorvastatin (LIPITOR) tablet 40 mg  40 mg Oral Q breakfast Rankin, Shuvon B, NP   40 mg at 02/01/18 0802  . chlordiazePOXIDE (LIBRIUM) capsule 25 mg  25 mg Oral Q6H PRN Nwoko, Agnes I, NP      . chlordiazePOXIDE (LIBRIUM) capsule 25 mg  25  mg Oral QID Lindell Spar I, NP   25 mg at 02/01/18 1306   Followed by  . [START ON 02/02/2018] chlordiazePOXIDE (LIBRIUM) capsule 25 mg  25 mg Oral TID Encarnacion Slates, NP       Followed by  . [START ON 02/03/2018] chlordiazePOXIDE (LIBRIUM) capsule 25 mg  25 mg Oral BH-qamhs Lindell Spar I, NP       Followed by  . [START ON 02/04/2018] chlordiazePOXIDE (LIBRIUM) capsule 25 mg  25 mg Oral Daily Nwoko, Agnes I, NP      . dicyclomine (BENTYL) tablet 20 mg  20 mg Oral Q6H PRN Ciel Chervenak, Myer Peer, MD      . hydrOXYzine (ATARAX/VISTARIL) tablet 25 mg  25 mg Oral Q6H PRN Nwoko, Agnes I, NP      . loperamide (IMODIUM) capsule 2-4 mg  2-4 mg Oral PRN Hiep Ollis, Myer Peer, MD      . magnesium hydroxide (MILK OF MAGNESIA) suspension 30 mL  30 mL Oral Daily PRN Hampton Abbot, MD      . methocarbamol (ROBAXIN) tablet 500 mg  500 mg Oral Q8H PRN Lindell Spar I, NP   500 mg at 02/01/18 1100  . metoprolol tartrate (LOPRESSOR) tablet 37.5 mg  37.5 mg Oral BH-qamhs Rankin, Shuvon B, NP   37.5 mg at 02/01/18 0802  . multivitamin with minerals tablet 1 tablet  1 tablet Oral Daily Lealer Marsland, Myer Peer, MD   1 tablet at 02/01/18 0802  . naproxen (NAPROSYN) tablet 500 mg  500 mg Oral BID PRN Deryl Ports, Myer Peer, MD      . nicotine (NICODERM CQ - dosed in mg/24 hours) patch 21 mg  21 mg Transdermal Daily Rankin, Shuvon B, NP   21 mg at 02/01/18 0804  . nitroGLYCERIN (NITROSTAT) SL tablet 0.4 mg  0.4 mg Sublingual Q5 min PRN Rankin, Shuvon B, NP      . ondansetron (ZOFRAN-ODT) disintegrating tablet 4 mg  4 mg Oral Q6H PRN Shanika Levings A, MD      . thiamine (B-1) injection 100 mg  100 mg Intramuscular Once Eldonna Neuenfeldt A, MD      . thiamine (VITAMIN B-1) tablet 100 mg  100 mg Oral Daily Tymier Lindholm, Myer Peer, MD   100 mg at 02/01/18 0802  . traZODone (DESYREL) tablet 100 mg  100 mg Oral QHS PRN Lindell Spar I, NP       PTA Medications: Medications Prior to Admission  Medication Sig Dispense Refill Last Dose  . acetaminophen  (TYLENOL) 500 MG tablet Take 500 mg by mouth every 6 (six) hours as needed.   Past Month at prn  . aspirin EC 325 MG tablet Take 325 mg by mouth daily.   01/30/2018 at Unknown time  . atorvastatin (LIPITOR) 40 MG tablet Take 40 mg by mouth daily.   01/30/2018 at Unknown time  . ibuprofen (ADVIL,MOTRIN) 200 MG tablet Take 400 mg by mouth every 6 (six) hours as needed.   Past Week at prn  . metoprolol tartrate (LOPRESSOR) 25 MG tablet Take 37.5 mg by mouth 2 (two) times daily.   01/30/2018 at Unknown time  . nitroGLYCERIN (NITROSTAT) 0.4 MG SL  tablet Place 0.4 mg under the tongue every 5 (five) minutes as needed for chest pain.   unknown at prn  . sertraline (ZOLOFT) 50 MG tablet Take 50 mg by mouth daily.   01/30/2018 at Unknown time   Musculoskeletal: Strength & Muscle Tone: within normal limits Gait & Station: normal Patient leans: N/A  Psychiatric Specialty Exam: Physical Exam  Nursing note and vitals reviewed. Constitutional: He appears well-developed.  HENT:  Head: Normocephalic.  Eyes: Pupils are equal, round, and reactive to light.  Neck: Normal range of motion.  Respiratory: Effort normal.  GI: Soft.  Genitourinary:  Genitourinary Comments: Deferred  Musculoskeletal: Normal range of motion.  Neurological: He is alert.  Skin: Skin is warm.    Review of Systems  Constitutional: Negative for chills, diaphoresis and malaise/fatigue.  HENT: Negative.   Eyes: Negative.   Respiratory: Negative.  Negative for cough and shortness of breath.   Cardiovascular: Negative.  Negative for chest pain, palpitations and leg swelling.       Hx. MI & CABG  Gastrointestinal: Positive for nausea.  Genitourinary: Negative.   Musculoskeletal: Positive for joint pain and myalgias.  Skin: Negative.   Neurological: Positive for tremors (Mild).  Psychiatric/Behavioral: Positive for depression and substance abuse (UDS (+) for opioid & THC). Negative for hallucinations, memory loss and suicidal ideas.  The patient is nervous/anxious and has insomnia.     Blood pressure 110/81, pulse 84, temperature 98.1 F (36.7 C), resp. rate 18, height '5\' 8"'  (1.727 m), weight 77.6 kg.Body mass index is 26 kg/m.  General Appearance: Casual, emotional & tearful.  Eye Contact:  Good  Speech:  Clear and Coherent and Normal Rate  Volume:  Normal  Mood:  Anxious and Depressed  Affect:  Tearful  Thought Process:  Coherent, Goal Directed and Descriptions of Associations: Intact  Orientation:  Full (Time, Place, and Person)  Thought Content:  Rumination, denies any hallucinations, delusions or paranoia.  Suicidal Thoughts:  Denies any thoughts, plants or intent. Denies any hx of suicide attempts  Homicidal Thoughts:  Denies  Memory:  Immediate;   Good Recent;   Good Remote;   Good  Judgement:  Fair  Insight:  Fair  Psychomotor Activity:  Restlessness and Tremor  Concentration:  Concentration: Fair and Attention Span: Fair  Recall:  Good  Fund of Knowledge:  Good  Language:  Good  Akathisia:  No  Handed:  Right  AIMS (if indicated):     Assets:  Communication Skills Desire for Improvement Social Support  ADL's:  Intact  Cognition:  WNL  Sleep:  Number of Hours: 5.75   Treatment Plan/Recommendations: 1. Admit for crisis management and stabilization, estimated length of stay 3-5 days.   2. Medication management to reduce current symptoms to base line and improve the patient's overall level of functioning: See MAR, Md's SRA & treatment plan.   Observation Level/Precautions:  15 minute checks  Laboratory:  Per UDS (+) for Opioid & THC  Psychotherapy: group sessions   Medications: See MAR   Consultations: As needed   Discharge Concerns: Safety, maintaining sobriety   Estimated LOS: 2-4 days  Other: admit to the [facility].     Physician Treatment Plan for Primary Diagnosis: Opioid dependence with opioid-induced mood disorder ([place])  Long Term Goal(s): Improvement in symptoms so as ready for  discharge  Short Term Goals: Ability to identify changes in lifestyle to reduce recurrence of condition will improve and Ability to demonstrate self-control will improve  Physician Treatment Plan for  Secondary Diagnosis: Principal Problem:   Opioid dependence with opioid-induced mood disorder ([facility]) Active Problems:   Polysubstance (including opioids) dependence, daily use ([facility])   Alcohol dependence with alcohol-induced mood disorder ([facility])   MDD (major depressive disorder), recurrent severe, without psychosis ([place])  Long Term Goal(s): Improvement in symptoms so as ready for discharge  Short Term Goals: Ability to identify and develop effective coping behaviors will improve, Compliance with prescribed medications will improve and Ability to identify triggers associated with substance abuse/mental health issues will improve  I certify that inpatient services furnished can reasonably be expected to improve the patient's condition.    Lindell Spar, NP, PMHNP, FNP-BC 8/22/20191:52 PM  I have discussed case with NP and have met with patient  Agree with NP note and assessment  32 year old male, presented to ED voluntarily requesting treatment for substance use disorder. Patient reports he had a recent MI/ CABG recently. After this he was started on percocet for management of pain. He reports that he unfortunately relapsed on illicit opiates and alcohol, and prior to admission was using heroin daily and drinking on most days of the week, at times up to a bottle of liquor per sitting .  Admission UDS positive for opiates and Cannabis, admission BAL negative.  He reports worsening anxiety and also endorses depression, which he attributes in part to feelings of guilt relating to relapse . He reports remote history of substance ( opiate ) abuse for several years, but states he had been sober since his early 74s. He has been prescribed Zoloft for depression, anxiety.  At this time patient presents  vaguely tremulous, not diaphoretic, not restless or agitated and vitals are stable -115/61, pulse 96. EKG NSR , QTc 442.   Dx- Opiate Use Disorder, Alcohol Use Disorder, Substance Induced Mood Disorder versus MDD .  Plan - Inpatient admission. Based on recent MI, will avoid Clonidine management and will address potential opiate WDL symptoms symptomatically as needed  .Has been started on standing alcohol detox protocol  ([facility]) . We have also reviewed importance of smoking cessation, particularly due to  history of CAD.
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
## 2018-02-01 NOTE — Progress Notes (Signed)
Early in the shift, pt came to NS asking for writer to request Seroquel for sleep tonight.  The previous RN reported that pt had been asking for this med most of the day.  Writer explained to pt that Trazodone 100 mg had been ordered for him tonight, and the evening provider wanted him to take this med to see if it would help him before making any changes.  Pt states he is having significant withdrawal symptoms and feels restless and achy.  Pt denies SI/HI/AVH during the evening assessment.  Pt was given prn meds to cover his symptoms and pain complaints.  Around 2330, pt came to the NS stating that he was not able to sleep and feeling worse than before taking the meds.  He said he felt more restless and anxious.  Writer informed provider who ordered a low dose of Seroquel (25 mg) for pt to start tonight.  Pt expressed gratefulness to Clinical research associatewriter and he was also given a snack before returning to bed.  Support and encouragement offered.  Discharge plans are in process.  Safety maintained with q15 minute checks.

## 2018-02-01 NOTE — [facility] Suicide Risk Assessment (Signed)
[facility] INPATIENT:  Family/Significant Other Suicide Prevention Education  Suicide Prevention Education:   Patient Refusal for Family/Significant Other Suicide Prevention Education: The patient Derek Little has refused to provide written consent for family/significant other to be provided Family/Significant Other Suicide Prevention Education during admission and/or prior to discharge.  Physician notified. Patient stated that he did not want his fiance to be contacted because she is dealing with their two children while he is hospitalized. He did not consent for anyone to be contacted.  CSW completed SPE with patient. Patient denies that he would ever attempt suicide because his children "mean the world" to him.    Roselyn Beringegina Eiliana Drone, MSW, LCSW Clinical Social Work 02/01/2018, 11:26 AM

## 2018-02-02 DIAGNOSIS — Z590 Homelessness: Secondary | ICD-10-CM

## 2018-02-02 DIAGNOSIS — F1920 Other psychoactive substance dependence, uncomplicated: Secondary | ICD-10-CM

## 2018-02-02 MED ORDER — SERTRALINE HCL 50 MG PO TABS
50.0000 mg | ORAL_TABLET | Freq: Every day | ORAL | Status: DC
  Administered 2018-02-02 – 2018-02-03 (×2): 50 mg via ORAL
  Filled 2018-02-02 (×5): qty 1

## 2018-02-02 MED ORDER — SERTRALINE HCL 50 MG PO TABS
50.0000 mg | ORAL_TABLET | Freq: Every day | ORAL | Status: DC
  Filled 2018-02-02 (×2): qty 1

## 2018-02-02 NOTE — Progress Notes (Signed)
Patient ID: Derek Little, male   DOB: 08-04-1985, 32 y.o.   MRN: [id number]  Nursing Progress Note [id number]  Data: Patient slept late and initially presented agitated/irritable towards the nurse who provided his morning medication. RN reports patient became demanding and stated he had to have his metoprolol despite a low BP this morning. Patient engages with more sad/sullen mood with Clinical research associatewriter. Patient is seen up in the milieu at times and is tearful throughout the day. Patient requesting to speak to SW and is anxious about being homeless. Patient reports his girlfriend was just here last week and they have a three year old child. PO fluids pushed throughout the day; patient was seen eating lunch. Patient provided but declined to complete their self-inventory sheet.  Patient currently denies SI/HI/AVH but does complain of withdrawal symptoms.   Action: Patient educated about and provided medication per provider's orders. Patient safety maintained with q15 min safety checks and frequent rounding. Low fall risk precautions in place. Emotional support given. 1:1 interaction and active listening provided. Patient encouraged to attend meals and groups. Patient encouraged to work on treatment plan and goals. Labs, vital signs and patient behavior monitored throughout shift.   Response: Patient agrees to come to staff if any thoughts of SI/HI develop or if patient develops intention of acting on thoughts. Patient remains safe on the unit at this time. Patient is interacting with peers appropriately on the unit. Will continue to support and monitor.

## 2018-02-02 NOTE — Plan of Care (Signed)
  Problem: Health Behavior/Discharge Planning: Goal: Identification of resources available to assist in meeting health care needs will improve Outcome: Progressing   Patient is requesting PRN medications to help manage his anxiety/withdrawal and working with SW to plan for discharge.

## 2018-02-02 NOTE — Progress Notes (Signed)
Pt has been up and down all night, c/o restlessness and not being able to sleep even after receiving the Seroquel 25 mg at 2345.  He was given a prn Librium 25 mg at 0451 and then returned to his room hoping to fall asleep.  Pt was encouraged to inform his doctor of his restless night in the morning.

## 2018-02-02 NOTE — Progress Notes (Signed)
Recreation Therapy Notes  Date: 8.23.19 Time: 0930 Location: 300 Hall Dayroom  Group Topic: Stress Management  Goal Area(s) Addresses:  Patient will verbalize importance of using healthy stress management.  Patient will identify positive emotions associated with healthy stress management.   Intervention: Stress Management  Activity :  Progressive Muscle Relaxation.  LRT introduced the stress management technique of progressive muscle relaxation.  LRT read a script on tensing then relaxing each muscle group individually.  Patients were to follow along as LRT read the script to engage in the activity.  Education:  Stress Management, Discharge Planning.   Education Outcome: Acknowledges edcuation/In group clarification offered/Needs additional education  Clinical Observations/Feedback: Patient did not attend group.    Siah Steely, LRT/CTRS         Payeton Germani A 02/02/2018 12:18 PM 
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
## 2018-02-02 NOTE — Tx Team (Signed)
Interdisciplinary Treatment and Diagnostic Plan Update  02/02/2018 Time of Session: 1610[name] MRN: [id number]  Principal Diagnosis: Opioid dependence with opioid-induced mood disorder (HCC)  Secondary Diagnoses: Principal Problem:   Opioid dependence with opioid-induced mood disorder (HCC) Active Problems:   MDD (major depressive disorder), recurrent severe, without psychosis (HCC)   Alcohol dependence with alcohol-induced mood disorder (HCC)   Polysubstance (including opioids) dependence, daily use (HCC)   Current Medications:  Current Facility-Administered Medications  Medication Dose Route Frequency Provider Last Rate Last Dose  . acetaminophen (TYLENOL) tablet 1,000 mg  1,000 mg Oral Q6H PRN Armandina Stammer I, NP      . alum & mag hydroxide-simeth (MAALOX/MYLANTA) 200-200-20 MG/5ML suspension 30 mL  30 mL Oral Q4H PRN Nelly Rout, MD      . aspirin tablet 325 mg  325 mg Oral Daily Rankin, Shuvon B, NP   325 mg at 02/02/18 1054  . atorvastatin (LIPITOR) tablet 40 mg  40 mg Oral Q breakfast Rankin, Shuvon B, NP   40 mg at 02/02/18 1047  . chlordiazePOXIDE (LIBRIUM) capsule 25 mg  25 mg Oral Q6H PRN Armandina Stammer I, NP   25 mg at 02/02/18 0451  . chlordiazePOXIDE (LIBRIUM) capsule 25 mg  25 mg Oral TID Armandina Stammer I, NP   25 mg at 02/02/18 1054   Followed by  . [START ON 02/03/2018] chlordiazePOXIDE (LIBRIUM) capsule 25 mg  25 mg Oral BH-qamhs Armandina Stammer I, NP       Followed by  . [START ON 02/04/2018] chlordiazePOXIDE (LIBRIUM) capsule 25 mg  25 mg Oral Daily Nwoko, Agnes I, NP      . dicyclomine (BENTYL) tablet 20 mg  20 mg Oral Q6H PRN Cobos, Fernando A, MD      . gabapentin (NEURONTIN) capsule 300 mg  300 mg Oral TID Armandina Stammer I, NP   300 mg at 02/02/18 1045  . hydrOXYzine (ATARAX/VISTARIL) tablet 25 mg  25 mg Oral Q6H PRN Armandina Stammer I, NP   25 mg at 02/01/18 2132  . loperamide (IMODIUM) capsule 2-4 mg  2-4 mg Oral PRN Cobos, Rockey Situ, MD      . magnesium  hydroxide (MILK OF MAGNESIA) suspension 30 mL  30 mL Oral Daily PRN Nelly Rout, MD      . methocarbamol (ROBAXIN) tablet 500 mg  500 mg Oral Q8H PRN Armandina Stammer I, NP   500 mg at 02/01/18 2132  . metoprolol tartrate (LOPRESSOR) tablet 37.5 mg  37.5 mg Oral BH-qamhs Rankin, Shuvon B, NP   37.5 mg at 02/02/18 1050  . multivitamin with minerals tablet 1 tablet  1 tablet Oral Daily Cobos, Rockey Situ, MD   1 tablet at 02/02/18 1046  . naproxen (NAPROSYN) tablet 500 mg  500 mg Oral BID PRN Cobos, Rockey Situ, MD   500 mg at 02/01/18 2132  . nicotine (NICODERM CQ - dosed in mg/24 hours) patch 21 mg  21 mg Transdermal Daily Rankin, Shuvon B, NP   21 mg at 02/02/18 1044  . nitroGLYCERIN (NITROSTAT) SL tablet 0.4 mg  0.4 mg Sublingual Q5 min PRN Rankin, Shuvon B, NP      . ondansetron (ZOFRAN-ODT) disintegrating tablet 4 mg  4 mg Oral Q6H PRN Cobos, Fernando A, MD      . QUEtiapine (SEROQUEL) tablet 25 mg  25 mg Oral QHS Nira Conn A, NP   25 mg at 02/01/18 2339  . sertraline (ZOLOFT) tablet 50 mg  50 mg Oral Daily Nwoko,  Nelda MarseilleAgnes I, NP   50 mg at 02/02/18 1212  . thiamine (B-1) injection 100 mg  100 mg Intramuscular Once Cobos, Fernando A, MD      . thiamine (VITAMIN B-1) tablet 100 mg  100 mg Oral Daily Cobos, Rockey SituFernando A, MD   100 mg at 02/02/18 1046  . traZODone (DESYREL) tablet 100 mg  100 mg Oral QHS PRN Armandina StammerNwoko, Agnes I, NP   100 mg at 02/01/18 2144   PTA Medications: Medications Prior to Admission  Medication Sig Dispense Refill Last Dose  . acetaminophen (TYLENOL) 500 MG tablet Take 500 mg by mouth every 6 (six) hours as needed.   Past Month at prn  . aspirin EC 325 MG tablet Take 325 mg by mouth daily.   01/30/2018 at Unknown time  . atorvastatin (LIPITOR) 40 MG tablet Take 40 mg by mouth daily.   01/30/2018 at Unknown time  . ibuprofen (ADVIL,MOTRIN) 200 MG tablet Take 400 mg by mouth every 6 (six) hours as needed.   Past Week at prn  . metoprolol tartrate (LOPRESSOR) 25 MG tablet Take 37.5 mg by  mouth 2 (two) times daily.   01/30/2018 at Unknown time  . nitroGLYCERIN (NITROSTAT) 0.4 MG SL tablet Place 0.4 mg under the tongue every 5 (five) minutes as needed for chest pain.   unknown at prn  . sertraline (ZOLOFT) 50 MG tablet Take 50 mg by mouth daily.   01/30/2018 at Unknown time    Patient Stressors: Financial difficulties Health problems Substance abuse  Patient Strengths: Wellsite geologistCommunication skills General fund of knowledge Supportive family/friends  Treatment Modalities: Medication Management, Group therapy, Case management,  1 to 1 session with clinician, Psychoeducation, Recreational therapy.   Physician Treatment Plan for Primary Diagnosis: Opioid dependence with opioid-induced mood disorder (HCC) Long Term Goal(s): Improvement in symptoms so as ready for discharge Improvement in symptoms so as ready for discharge   Short Term Goals: Ability to identify changes in lifestyle to reduce recurrence of condition will improve Ability to demonstrate self-control will improve Ability to identify and develop effective coping behaviors will improve Compliance with prescribed medications will improve Ability to identify triggers associated with substance abuse/mental health issues will improve  Medication Management: Evaluate patient's response, side effects, and tolerance of medication regimen.  Therapeutic Interventions: 1 to 1 sessions, Unit Group sessions and Medication administration.  Evaluation of Outcomes: Progressing  Physician Treatment Plan for Secondary Diagnosis: Principal Problem:   Opioid dependence with opioid-induced mood disorder (HCC) Active Problems:   MDD (major depressive disorder), recurrent severe, without psychosis (HCC)   Alcohol dependence with alcohol-induced mood disorder (HCC)   Polysubstance (including opioids) dependence, daily use (HCC)  Long Term Goal(s): Improvement in symptoms so as ready for discharge Improvement in symptoms so as ready for  discharge   Short Term Goals: Ability to identify changes in lifestyle to reduce recurrence of condition will improve Ability to demonstrate self-control will improve Ability to identify and develop effective coping behaviors will improve Compliance with prescribed medications will improve Ability to identify triggers associated with substance abuse/mental health issues will improve     Medication Management: Evaluate patient's response, side effects, and tolerance of medication regimen.  Therapeutic Interventions: 1 to 1 sessions, Unit Group sessions and Medication administration.  Evaluation of Outcomes: Progressing   RN Treatment Plan for Primary Diagnosis: Opioid dependence with opioid-induced mood disorder (HCC) Long Term Goal(s): Knowledge of disease and therapeutic regimen to maintain health will improve  Short Term Goals: Ability to remain  free from injury will improve, Ability to demonstrate self-control, Ability to verbalize feelings will improve and Ability to identify and develop effective coping behaviors will improve  Medication Management: RN will administer medications as ordered by provider, will assess and evaluate patient's response and provide education to patient for prescribed medication. RN will report any adverse and/or side effects to prescribing provider.  Therapeutic Interventions: 1 on 1 counseling sessions, Psychoeducation, Medication administration, Evaluate responses to treatment, Monitor vital signs and CBGs as ordered, Perform/monitor CIWA, COWS, AIMS and Fall Risk screenings as ordered, Perform wound care treatments as ordered.  Evaluation of Outcomes: Progressing   LCSW Treatment Plan for Primary Diagnosis: Opioid dependence with opioid-induced mood disorder (HCC) Long Term Goal(s): Safe transition to appropriate next level of care at discharge, Engage patient in therapeutic group addressing interpersonal concerns.  Short Term Goals: Engage patient in  aftercare planning with referrals and resources, Facilitate patient progression through stages of change regarding substance use diagnoses and concerns and Identify triggers associated with mental health/substance abuse issues  Therapeutic Interventions: Assess for all discharge needs, 1 to 1 time with Social worker, Explore available resources and support systems, Assess for adequacy in community support network, Educate family and significant other(s) on suicide prevention, Complete Psychosocial Assessment, Interpersonal group therapy.  Evaluation of Outcomes: Progressing   Progress in Treatment: Attending groups: Yes. Participating in groups: Yes. Taking medication as prescribed: Yes. Toleration medication: Yes. Family/Significant other contact made: SPE completed with pt; pt declined to consent to collateral contact.  Patient understands diagnosis: Yes. Discussing patient identified problems/goals with staff: Yes. Medical problems stabilized or resolved: Yes. Denies suicidal/homicidal ideation: Yes. Issues/concerns per patient self-inventory: No. Other: n/a   New problem(s) identified: Yes, Describe:  pt states he is homeless and seeking housing for himself, a child, and his girlfriend. CSW assessing--possible family shelter options however pt is aware that this is not a guarentee and encouraged him to continue reaching out to social supports  New Short Term/Long Term Goal(s): detox, medication management for mood stabilization; elimination of SI thoughts; development of comprehensive mental wellness/sobriety plan.   Patient Goals:  "To get off drugs and learn how to stay off them. Also to find housing."   Discharge Plan or Barriers: CSW assessing for appropriate referrals. Pt provided with [facility] halfway house information, extended stay motel list, boarding house list, and family shelter information. [facility] pamphlet, [facility] information, and AA/NA information provided to  patient for additional community support and resources.   Reason for Continuation of Hospitalization: Anxiety Depression Medication stabilization Withdrawal symptoms  Estimated Length of Stay: Monday, 02/05/18  Attendees: Patient: 02/02/2018 12:16 PM  Physician: Dr, Jama Flavors MD; Dr. Altamese Apex MD 02/02/2018 12:16 PM  Nursing: Lorin Glass RN; Marchelle Folks RN; Patty RN 02/02/2018 12:16 PM  RN Care Manager:x 02/02/2018 12:16 PM  Social Worker: Corrie Mckusick LCSW 02/02/2018 12:16 PM  Recreational Therapist: x 02/02/2018 12:16 PM  Other: Renold Don NP 02/02/2018 12:16 PM  Other:  02/02/2018 12:16 PM  Other: 02/02/2018 12:16 PM    Scribe for Treatment Team: Rona Ravens, LCSW 02/02/2018 12:16 PM

## 2018-02-03 MED ORDER — TRAZODONE HCL 100 MG PO TABS
100.0000 mg | ORAL_TABLET | Freq: Every evening | ORAL | Status: DC | PRN
  Administered 2018-02-03: 100 mg via ORAL
  Filled 2018-02-03 (×4): qty 1

## 2018-02-03 MED ORDER — NICOTINE 21 MG/24HR TD PT24: 21.0000 mg | MEDICATED_PATCH | Freq: Every day | TRANSDERMAL | 0 refills | Status: DC

## 2018-02-03 MED ORDER — TRAZODONE HCL 100 MG PO TABS: 100.0000 mg | ORAL_TABLET | Freq: Every evening | ORAL | 0 refills | Status: DC | PRN

## 2018-02-03 MED ORDER — QUETIAPINE FUMARATE 25 MG PO TABS: 25.0000 mg | ORAL_TABLET | Freq: Every day | ORAL | 0 refills | Status: DC

## 2018-02-03 MED ORDER — GABAPENTIN 300 MG PO CAPS: 300.0000 mg | ORAL_CAPSULE | Freq: Three times a day (TID) | ORAL | 0 refills | Status: DC

## 2018-02-03 NOTE — Progress Notes (Signed)
Pt reports that earlier today he was having a difficult time, not because he was depressed or withdrawing, but because he was worried about housing for his family.  He said that he was feeling hopeless and nervous about what to do.  He denies SI/HI/AVH this evening.  He says he received some good encouraging news that his family has a place to stay now and he is eager to be discharged.  He was encouraged to talk with the doctor tomorrow about discharge as we could not discharge him tonight.  He was fine with being here tonight.  His mood has been better this evening and he feels he will be able to sleep better tonight.  Support and encouragement offered.  Discharge plans are in process.  Safety maintained with q15 minute checks.

## 2018-02-03 NOTE — Progress Notes (Signed)
Patient ID: Derek Little, male   DOB: 11-03-1985, 32 y.o.   MRN: [id number]   Pt discharged home with wife, no issues or concerns noted. Pt reported that he was negative SI/HI, no AH/VH noted.

## 2018-02-03 NOTE — Progress Notes (Signed)
  [facility]  Will you be returning to the same living situation after discharge:  Yes. Pt is returning with his family.  At discharge, do you have transportation home?: Yes,  bus pass  Do you have the ability to pay for your medications: Yes,  mental health  Release of information consent forms completed and submitted to medical records by CSW.   Patient to Follow up at: Follow-up Information    [facility].   Specialty:  Behavioral Health Why:  Hospital follow-up on Wed, 8/28 at 8:00AM. Please bring the following if you have them: photo ID, social security card, any proof of income, and hospital discharge paperwork. Thank you.  Contact information: [address] [place] [id number] [phone]           Next level of care provider has access to [facility] Link:no  Safety Planning and Suicide Prevention discussed: Yes,  SPE completed with pt; pt declined to consent to collateral contact.   Have you used any form of tobacco in the last 30 days? (Cigarettes, Smokeless Tobacco, Cigars, and/or Pipes): Yes  Has patient been referred to the Quitline?: Patient refused referral  Patient has been referred for addiction treatment: Yes  Rona RavensHeather S Snow Peoples, LCSW 02/03/2018, 9:29 AM

## 2018-02-03 NOTE — [facility] Group Notes (Signed)
[facility] Group Notes:  (Nursing/MHT/Case Management/Adjunct)  Date:  02/03/2018  Time:  1:43 PM  Type of Therapy:  Psychoeducational Skills  Participation Level:  Active  Participation Quality:  Appropriate  Affect:  Appropriate  Cognitive:  Appropriate  Insight:  Appropriate  Engagement in Group:  Engaged  Modes of Intervention:  Problem-solving  Summary of Progress/Problems: Pt attended Psychoeducational group with top topic anger management.   Challis Crill Shanta 02/03/2018, 1:43 PM 

## 2018-02-03 NOTE — Discharge Summary (Addendum)
Physician Discharge Summary Note  Patient:  Derek Little is an 32 y.o., male MRN:  [id number] DOB:  1986/03/31 Patient phone:  [phone] (home)  Patient address:   [address] [place] [address],  Total Time spent with patient: 20 minutes  Date of Admission:  01/31/2018 Date of Discharge: 02/03/2018  Reason for Admission: Per assessment note on admission:32 year old AA male with hx of polysubstance use disorder including opioid drugs. He is being admitted to the [facility] from the [facility] ED with complaints of increased drug & alcohol use. He is requesting help with his addictions. Chart review indicated that patient had hx of drug use, was sober x 10 years, relapsed after a heart surgery after a heart attack last April, 2019, was prescribed Percocet which triggered his cravings for opioid use, he relapsed & has been abusing heroin, alcohol & weed x 4 months.  During this assessment, Derek Little reports tearfully, "I was dropped off at the [facility] ED on Tuesday (2 days ago). I was at the ED just over night. I need some medicines for withdrawal symptoms please. I'm nauseated & my whole body hurts. I'm trembling inside. I have not had any medicines since being here. I'm having withdrawal symptoms from opioid. I'm craving drugs badly. I feel like leaving this hospital now & go get high. I have been sniffing heroin & consuming a lot of alcohol x 4 months. I had drug problems from the age of 1 to 19. I did have some substance abuse treatment while living in [place]. I have been drug free x 10 years. So, in April of this year, without any warnings, I had a heart attack, had heart surgery (CABG), was prescribed Percocet tablets. I thought that I could handle taking opioids again. I was wrong. It triggered my addictions. And for 4 months, I have been sniffing heroin, drinking heavily & smoking pots. I'm not suicidal & no attempts".  Principal Problem: Opioid dependence  with opioid-induced mood disorder [facility]) Discharge Diagnoses: Patient Active Problem List   Diagnosis Date Noted  . Polysubstance (including opioids) dependence, daily use (HCC) [F19.20] 02/01/2018  . Opioid dependence with opioid-induced mood disorder (HCC) [F11.24]   . Alcohol dependence with alcohol-induced mood disorder (HCC) [F10.24]   . MDD (major depressive disorder), recurrent severe, without psychosis (HCC) [F33.2] 01/31/2018    Past Psychiatric History:  Past Medical History:  Past Medical History:  Diagnosis Date  . CAD (coronary artery disease)     Past Surgical History:  Procedure Laterality Date  . CARDIAC SURGERY     Family History: History reviewed. No pertinent family history. Family Psychiatric  History:  Social History:  Social History   Substance and Sexual Activity  Alcohol Use Yes     Social History   Substance and Sexual Activity  Drug Use Yes  . Types: Marijuana   Comment: Heroin /Percocet    Social History   Socioeconomic History  . Marital status: Single    Spouse name: Not on file  . Number of children: Not on file  . Years of education: Not on file  . Highest education level: Not on file  Occupational History  . Not on file  Social Needs  . Financial resource strain: Not on file  . Food insecurity:    Worry: Not on file    Inability: Not on file  . Transportation needs:    Medical: Not on file    Non-medical: Not on  file  Tobacco Use  . Smoking status: Current Every Day Smoker  . Smokeless tobacco: Former NeurosurgeonUser    Types: Snuff  Substance and Sexual Activity  . Alcohol use: Yes  . Drug use: Yes    Types: Marijuana    Comment: Heroin /Percocet  . Sexual activity: Yes    Birth control/protection: Condom  Lifestyle  . Physical activity:    Days per week: Not on file    Minutes per session: Not on file  . Stress: Not on file  Relationships  . Social connections:    Talks on phone: Not on file    Gets together: Not on file     Attends religious service: Not on file    Active member of club or organization: Not on file    Attends meetings of clubs or organizations: Not on file    Relationship status: Not on file  Other Topics Concern  . Not on file  Social History Narrative  . Not on file    Hospital Course:  Derek Little was admitted for Opioid dependence with opioid-induced mood disorder [facility](HCC) and crisis management.  Pt was treated discharged with the medications listed below under Medication List.  Medical problems were identified and treated as needed.  Home medications were restarted as appropriate.  Improvement was monitored by observation and Derek Little 's daily report of symptom reduction.  Emotional and mental status was monitored by daily self-inventory reports completed by Derek Little and clinical staff.         Derek Little was evaluated by the treatment team for stability and plans for continued recovery upon discharge. Derek Little 's motivation was an integral factor for scheduling further treatment. Employment, transportation, bed availability, health status, family support, and any pending legal issues were also considered during hospital stay. Pt was offered further treatment options upon discharge including but not limited to Residential, Intensive Outpatient, and Outpatient treatment.  Derek Little will follow up with the services as listed below under Follow Up Information.     Upon completion of this admission the patient was both mentally and medically stable for discharge denying suicidal/homicidal ideation, auditory/visual/tactile hallucinations, delusional thoughts and paranoia.      Derek Little responded well to treatment with Librium protocol, Neurontin 300 mg TID, Zoloft 50 mg  and Seroquel 25 mg without adverse effects. Initially, pt did complain of nausea  with these medications, but this resolved. Pt demonstrated improvement without reported or observed adverse  effects to the point of stability appropriate for outpatient management. Pertinent labs include: CMP with UDS + opiates , for which outpatient follow-up is necessary for lab recheck as mentioned below. Reviewed CBC, CMP, BAL, and UDS; all unremarkable aside from noted exceptions.   Physical Findings: AIMS: Facial and Oral Movements Muscles of Facial Expression: None, normal Lips and Perioral Area: None, normal Jaw: None, normal Tongue: None, normal,Extremity Movements Upper (arms, wrists, hands, fingers): None, normal Lower (legs, knees, ankles, toes): None, normal, Trunk Movements Neck, shoulders, hips: None, normal, Overall Severity Severity of abnormal movements (highest score from questions above): None, normal Incapacitation due to abnormal movements: None, normal Patient's awareness of abnormal movements (rate only patient's report): No Awareness, Dental Status Current problems with teeth and/or dentures?: No Does patient usually wear dentures?: No  CIWA:  CIWA-Ar Total: 1 COWS:  COWS Total Score: 1  Musculoskeletal: Strength & Muscle Tone: within normal limits Gait & Station: normal Patient leans: N/A  Psychiatric Specialty Exam: See SRA BY MD  Physical Exam  Vitals reviewed. Constitutional: He appears well-developed and well-nourished.  Neurological: He is alert.    Review of Systems  Psychiatric/Behavioral: Negative for depression (improving ) and suicidal ideas. The patient is not nervous/anxious.   All other systems reviewed and are negative.   Blood pressure (!) 86/54, pulse 62, temperature 98.4 F (36.9 C), temperature source Oral, resp. rate 16, height 5\' 8"  (1.727 m), weight 77.6 kg.Body mass index is 26 kg/m.    Have you used any form of tobacco in the last 30 days? (Cigarettes, Smokeless Tobacco, Cigars, and/or Pipes): Yes  Has this patient used any form of tobacco in the last 30 days? (Cigarettes, Smokeless Tobacco, Cigars, and/or Pipes)  No  Blood Alcohol  level:  Lab Results  Component Value Date   ETH <10 01/31/2018    Metabolic Disorder Labs:  No results found for: HGBA1C, MPG No results found for: PROLACTIN No results found for: CHOL, TRIG, HDL, CHOLHDL, VLDL, LDLCALC  See Psychiatric Specialty Exam and Suicide Risk Assessment completed by Attending Physician prior to discharge.  Discharge destination:  Home  Is patient on multiple antipsychotic therapies at discharge:  No   Has Patient had three or more failed trials of antipsychotic monotherapy by history:  No  Recommended Plan for Multiple Antipsychotic Therapies: NA  Discharge Instructions    Diet - low sodium heart healthy   Complete by:  As directed    Discharge instructions   Complete by:  As directed    Take all medications as prescribed. Keep all follow-up appointments as scheduled.  Do not consume alcohol or use illegal drugs while on prescription medications. Report any adverse effects from your medications to your primary care provider promptly.  In the event of recurrent symptoms or worsening symptoms, call 911, a crisis hotline, or go to the nearest emergency department for evaluation.   Increase activity slowly   Complete by:  As directed      Allergies as of 02/03/2018   No Known Allergies     Medication List    STOP taking these medications   acetaminophen 500 MG tablet Commonly known as:  TYLENOL   aspirin EC 325 MG tablet   ibuprofen 200 MG tablet Commonly known as:  ADVIL,MOTRIN   sertraline 50 MG tablet Commonly known as:  ZOLOFT     TAKE these medications     Indication  atorvastatin 40 MG tablet Commonly known as:  LIPITOR Take 40 mg by mouth daily.  Indication:  High Amount of Fats in the Blood   gabapentin 300 MG capsule Commonly known as:  NEURONTIN Take 1 capsule (300 mg total) by mouth 3 (three) times daily.  Indication:  Substance withdrawal syndrome.   metoprolol tartrate 25 MG tablet Commonly known as:  LOPRESSOR Take  37.5 mg by mouth 2 (two) times daily.  Indication:  High Blood Pressure Disorder   nicotine 21 mg/24hr patch Commonly known as:  NICODERM CQ - dosed in mg/24 hours Place 1 patch (21 mg total) onto the skin daily.  Indication:  Nicotine Addiction   nitroGLYCERIN 0.4 MG SL tablet Commonly known as:  NITROSTAT Place 0.4 mg under the tongue every 5 (five) minutes as needed for chest pain.  Indication:  Acute Heart Attack, Congestive Heart Failure   QUEtiapine 25 MG tablet Commonly known as:  SEROQUEL Take 1 tablet (25 mg total) by mouth at bedtime.  Indication:  Generalized Anxiety Disorder   traZODone 100 MG tablet Commonly known as:  DESYREL  Take 1 tablet (100 mg total) by mouth at bedtime and may repeat dose one time if needed.  Indication:  Trouble Sleeping      Follow-up Information    [facility] Follow up.   Specialty:  Behavioral Health Why:  Hospital follow-up on Wed, 8/28 at 8:00AM. Please bring the following if you have them: photo ID, social security card, any proof of income, and hospital discharge paperwork. Thank you.  [address] [place] [address] [phone]           Follow-up recommendations:  Activity:  as tolerated Diet:  heart healthy  Comments:  Take all medications as prescribed. Keep all follow-up appointments as scheduled.  Do not consume alcohol or use illegal drugs while on prescription medications. Report any adverse effects from your medications to your primary care provider promptly.  In the event of recurrent symptoms or worsening symptoms, call 911, a crisis hotline, or go to the nearest emergency department for evaluation.   Signed: Oneta Rack, NP 02/03/2018, 8:45 AM   Patient seen, Suicide Assessment Completed.  Disposition Plan Reviewed

## 2018-02-03 NOTE — [facility] Suicide Risk Assessment (Signed)
[facility]   Principal Problem: Opioid dependence with opioid-induced mood disorder [facility](HCC) Discharge Diagnoses:  Patient Active Problem List   Diagnosis Date Noted  . Polysubstance (including opioids) dependence, daily use (HCC) [F19.20] 02/01/2018  . Opioid dependence with opioid-induced mood disorder (HCC) [F11.24]   . Alcohol dependence with alcohol-induced mood disorder (HCC) [F10.24]   . MDD (major depressive disorder), recurrent severe, without psychosis (HCC) [F33.2] 01/31/2018    Total Time spent with patient: 30 minutes  Musculoskeletal: Strength & Muscle Tone: within normal limits Gait & Station: normal Patient leans: N/A  Psychiatric Specialty Exam: ROS no headache, no chest pain, no shortness of breath , no vomiting , no fever , no chills   Blood pressure 102/60, pulse 87, temperature 98.4 F (36.9 C), temperature source Oral, resp. rate 16, height 5\' 8"  (1.727 m), weight 77.6 kg.Body mass index is 26 kg/m.  General Appearance: Well Groomed  Eye Contact::  Good  Speech:  Normal Rate409  Volume:  Normal  Mood:  reports much improved mood, denies feeling depressed, presents euthymic, with bright affect   Affect:  Appropriate and Full Range  Thought Process:  Linear and Descriptions of Associations: Intact  Orientation:  Full (Time, Place, and Person)  Thought Content:  denies hallucinations, no delusions, not internally preoccupied   Suicidal Thoughts:  No denies suicidal or self injurious ideations, denies homicidal or violent ideations  Homicidal Thoughts:  No denies homicidal or violent ideations  Memory:  recent and remote grossly intact   Judgement:  Other:  improving   Insight:  Fair and improving   Psychomotor Activity:  Normal  Concentration:  Good  Recall:  Good  Fund of Knowledge:Good  Language: Good  Akathisia:  Negative  Handed:  Right  AIMS (if indicated):     Assets:  Communication Skills Desire for  Improvement Resilience  Sleep:  Number of Hours: 6  Cognition: WNL  ADL's:  Intact   Mental Status Per Nursing Assessment::   On Admission:  NA  Demographic Factors:  32 year old , single, has two children  Loss Factors: Recent MI ( April 2019), recent relocation from [place], housing difficulties , substance abuse   Historical Factors: Remote history of opiate dependence, reports he relapsed after recent MI.  No history of prior psychiatric admissions, no history of suicide attempts  Risk Reduction Factors:   Responsible for children under 34 years of age, Sense of responsibility to family and Positive coping skills or problem solving skills  Continued Clinical Symptoms:  Presents alert, attentive,well related, pleasant, mood much improved and currently euthymic, bright, affect full in range, no thought disorder, no suicidal or self injurious ideations,no homicidal or violent ideations, no psychotic symptoms. Denies residual withdrawal symptoms and vitals are stable Denies medication side effects. Behavior on unit in good control, pleasant on approach. States that he has been able to solve his housing issues, which was a major stressor,states that a friend of his mother's will allow him to live in a home they own. Patient states that once this issue was resolved he felt " a whole lot better, I just need to make sure my kid has a decent place to live "  Cognitive Features That Contribute To Risk:  No gross cognitive deficits noted upon discharge. Is alert , attentive, and oriented x 3    Suicide Risk:  Mild:  Suicidal ideation of limited frequency, intensity, duration, and specificity.  There are no identifiable plans, no associated intent, mild  dysphoria and related symptoms, good self-control (both objective and subjective assessment), few other risk factors, and identifiable protective factors, including available and accessible social support.  Follow-up Information    [facility]  Follow up.   Specialty:  Behavioral Health Why:  Hospital follow-up on Wed, 8/28 at 8:00AM. Please bring the following if you have them: photo ID, social security card, any proof of income, and hospital discharge paperwork. Thank you.  Contact information: [address] [place] [address] [phone]           Plan Of Care/Follow-up recommendations:  Activity:  as tolerated  Diet:  heart healthy Tests:  NA Other:  See below  Patient expresses readiness for discharge, no current grounds for involuntary commitment Leaving unit in good spirits  He follows up at [facility] and with Dr. Pasi ( Cardiologist ) for history of MI/CAD Encouraged to attend NA meetings regularly and we spoke about benefits of smoking cessation.  Craige Cotta, MD 02/03/2018, 11:44 AM

## 2018-02-03 NOTE — BHH Group Notes (Signed)
LCSW Group Therapy Note  02/03/2018   10:00--11:00am   Type of Therapy and Topic:  Group Therapy: Anger Cues and Responses  Participation Level:  Active   Description of Group:   In this group, patients learned how to recognize the physical, cognitive, emotional, and behavioral responses they have to anger-provoking situations.  They identified a recent time they became angry and how they reacted.  They analyzed how their reaction was possibly beneficial and how it was possibly unhelpful.  The group discussed a variety of healthier coping skills that could help with such a situation in the future.  Deep breathing was practiced briefly.  Therapeutic Goals: 1. Patients will remember their last incident of anger and how they felt emotionally and physically, what their thoughts were at the time, and how they behaved. 2. Patients will identify how their behavior at that time worked for them, as well as how it worked against them. 3. Patients will explore possible new behaviors to use in future anger situations. 4. Patients will learn that anger itself is normal and cannot be eliminated, and that healthier reactions can assist with resolving conflict rather than worsening situations.  Summary of Patient Progress: Did not attend Therapeutic Modalities:   Cognitive Behavioral Therapy  Evorn Gongonnie D Matthew Cina

## 2018-02-16 ENCOUNTER — Inpatient Hospital Stay (HOSPITAL_COMMUNITY)
Admission: AD | Admit: 2018-02-16 | Discharge: 2018-02-19 | DRG: 885 | Disposition: A | Discharge status: Home / Self Care | Payer: Medicaid Other | Source: Intra-hospital | Attending: Emergency Medicine | Admitting: Emergency Medicine

## 2018-02-16 ENCOUNTER — Encounter (HOSPITAL_COMMUNITY): Payer: Self-pay | Admitting: *Deleted

## 2018-02-16 ENCOUNTER — Emergency Department (HOSPITAL_COMMUNITY)
Admission: EM | Admit: 2018-02-16 | Discharge: 2018-02-16 | Disposition: A | Discharge status: Other Acute Inpatient Hospital | Payer: Medicaid Other | Attending: Emergency Medicine | Admitting: Emergency Medicine

## 2018-02-16 ENCOUNTER — Encounter (HOSPITAL_COMMUNITY): Payer: Self-pay | Admitting: Emergency Medicine

## 2018-02-16 ENCOUNTER — Other Ambulatory Visit: Payer: Self-pay

## 2018-02-16 DIAGNOSIS — Z79899 Other long term (current) drug therapy: Secondary | ICD-10-CM

## 2018-02-16 DIAGNOSIS — Z951 Presence of aortocoronary bypass graft: Secondary | ICD-10-CM

## 2018-02-16 DIAGNOSIS — F332 Major depressive disorder, recurrent severe without psychotic features: Secondary | ICD-10-CM | POA: Insufficient documentation

## 2018-02-16 DIAGNOSIS — I252 Old myocardial infarction: Secondary | ICD-10-CM | POA: Diagnosis not present

## 2018-02-16 DIAGNOSIS — Z811 Family history of alcohol abuse and dependence: Secondary | ICD-10-CM

## 2018-02-16 DIAGNOSIS — F419 Anxiety disorder, unspecified: Secondary | ICD-10-CM | POA: Diagnosis present

## 2018-02-16 DIAGNOSIS — F1920 Other psychoactive substance dependence, uncomplicated: Secondary | ICD-10-CM | POA: Diagnosis not present

## 2018-02-16 DIAGNOSIS — Z7982 Long term (current) use of aspirin: Secondary | ICD-10-CM | POA: Insufficient documentation

## 2018-02-16 DIAGNOSIS — Z560 Unemployment, unspecified: Secondary | ICD-10-CM

## 2018-02-16 DIAGNOSIS — I2510 Atherosclerotic heart disease of native coronary artery without angina pectoris: Secondary | ICD-10-CM | POA: Insufficient documentation

## 2018-02-16 DIAGNOSIS — F1124 Opioid dependence with opioid-induced mood disorder: Secondary | ICD-10-CM | POA: Diagnosis not present

## 2018-02-16 DIAGNOSIS — M791 Myalgia, unspecified site: Secondary | ICD-10-CM | POA: Diagnosis present

## 2018-02-16 DIAGNOSIS — F1020 Alcohol dependence, uncomplicated: Secondary | ICD-10-CM | POA: Diagnosis present

## 2018-02-16 DIAGNOSIS — F17210 Nicotine dependence, cigarettes, uncomplicated: Secondary | ICD-10-CM | POA: Diagnosis present

## 2018-02-16 DIAGNOSIS — Z590 Homelessness: Secondary | ICD-10-CM

## 2018-02-16 DIAGNOSIS — J3489 Other specified disorders of nose and nasal sinuses: Secondary | ICD-10-CM | POA: Diagnosis present

## 2018-02-16 DIAGNOSIS — F1120 Opioid dependence, uncomplicated: Secondary | ICD-10-CM | POA: Diagnosis present

## 2018-02-16 DIAGNOSIS — E7800 Pure hypercholesterolemia, unspecified: Secondary | ICD-10-CM | POA: Diagnosis present

## 2018-02-16 DIAGNOSIS — F17200 Nicotine dependence, unspecified, uncomplicated: Secondary | ICD-10-CM | POA: Diagnosis not present

## 2018-02-16 DIAGNOSIS — F1024 Alcohol dependence with alcohol-induced mood disorder: Secondary | ICD-10-CM | POA: Insufficient documentation

## 2018-02-16 DIAGNOSIS — F1910 Other psychoactive substance abuse, uncomplicated: Secondary | ICD-10-CM

## 2018-02-16 DIAGNOSIS — R072 Precordial pain: Secondary | ICD-10-CM

## 2018-02-16 DIAGNOSIS — R45851 Suicidal ideations: Secondary | ICD-10-CM | POA: Diagnosis present

## 2018-02-16 DIAGNOSIS — R450 Nervousness: Secondary | ICD-10-CM | POA: Diagnosis not present

## 2018-02-16 DIAGNOSIS — G4700 Insomnia, unspecified: Secondary | ICD-10-CM | POA: Diagnosis present

## 2018-02-16 DIAGNOSIS — Z630 Problems in relationship with spouse or partner: Secondary | ICD-10-CM | POA: Diagnosis not present

## 2018-02-16 DIAGNOSIS — F1290 Cannabis use, unspecified, uncomplicated: Secondary | ICD-10-CM | POA: Diagnosis present

## 2018-02-16 DIAGNOSIS — F1190 Opioid use, unspecified, uncomplicated: Secondary | ICD-10-CM | POA: Diagnosis not present

## 2018-02-16 DIAGNOSIS — F329 Major depressive disorder, single episode, unspecified: Secondary | ICD-10-CM | POA: Diagnosis present

## 2018-02-16 LAB — COMPREHENSIVE METABOLIC PANEL
ALBUMIN: 3.9 g/dL (ref 3.5–5.0)
ALK PHOS: 51 U/L (ref 38–126)
ALT: 15 U/L (ref 0–44)
AST: 18 U/L (ref 15–41)
Anion gap: 10 (ref 5–15)
BILIRUBIN TOTAL: 0.6 mg/dL (ref 0.3–1.2)
BUN: 9 mg/dL (ref 6–20)
CALCIUM: 9.3 mg/dL (ref 8.9–10.3)
CO2: 27 mmol/L (ref 22–32)
CREATININE: 0.86 mg/dL (ref 0.61–1.24)
Chloride: 105 mmol/L (ref 98–111)
GFR calc Af Amer: >60 mL/min (ref >60)
GFR calc non Af Amer: >60 mL/min (ref >60)
GLUCOSE: 80 mg/dL (ref 70–99)
Potassium: 4 mmol/L (ref 3.5–5.1)
SODIUM: 142 mmol/L (ref 135–145)
TOTAL PROTEIN: 6.3 g/dL — AB (ref 6.5–8.1)

## 2018-02-16 LAB — RAPID URINE DRUG SCREEN, HOSP PERFORMED
Amphetamines: NOT DETECTED
Barbiturates: NOT DETECTED
Benzodiazepines: POSITIVE — AB
COCAINE: NOT DETECTED
Opiates: POSITIVE — AB
TETRAHYDROCANNABINOL: POSITIVE — AB

## 2018-02-16 LAB — SALICYLATE LEVEL: Salicylate Lvl: <7 mg/dL (ref 2.8–30.0)

## 2018-02-16 LAB — CBC
HEMATOCRIT: 41.4 % (ref 39.0–52.0)
HEMOGLOBIN: 14 g/dL (ref 13.0–17.0)
MCH: 30.1 pg (ref 26.0–34.0)
MCHC: 33.8 g/dL (ref 30.0–36.0)
MCV: 89 fL (ref 78.0–100.0)
Platelets: 223 10*3/uL (ref 150–400)
RBC: 4.65 MIL/uL (ref 4.22–5.81)
RDW: 12.1 % (ref 11.5–15.5)
WBC: 9.4 10*3/uL (ref 4.0–10.5)

## 2018-02-16 LAB — ETHANOL: Alcohol, Ethyl (B): <10 mg/dL (ref <10)

## 2018-02-16 LAB — ACETAMINOPHEN LEVEL

## 2018-02-16 MED ORDER — LOPERAMIDE HCL 2 MG PO CAPS: 2.0000 mg | ORAL_CAPSULE | ORAL | Status: DC | PRN

## 2018-02-16 MED ORDER — SERTRALINE HCL 25 MG PO TABS
25.0000 mg | ORAL_TABLET | Freq: Every day | ORAL | Status: DC
  Administered 2018-02-16: 25 mg via ORAL
  Filled 2018-02-16: qty 1

## 2018-02-16 MED ORDER — HYDROXYZINE HCL 25 MG PO TABS
25.0000 mg | ORAL_TABLET | Freq: Four times a day (QID) | ORAL | Status: DC | PRN
  Administered 2018-02-16 – 2018-02-18 (×5): 25 mg via ORAL
  Filled 2018-02-16 (×5): qty 1

## 2018-02-16 MED ORDER — GABAPENTIN 100 MG PO CAPS
100.0000 mg | ORAL_CAPSULE | Freq: Three times a day (TID) | ORAL | Status: DC
  Administered 2018-02-16 – 2018-02-19 (×9): 100 mg via ORAL
  Filled 2018-02-16 (×8): qty 1
  Filled 2018-02-16: qty 21
  Filled 2018-02-16 (×4): qty 1
  Filled 2018-02-16: qty 21
  Filled 2018-02-16: qty 1
  Filled 2018-02-16: qty 21
  Filled 2018-02-16 (×8): qty 1

## 2018-02-16 MED ORDER — LORAZEPAM 2 MG/ML IJ SOLN: 0.0000 mg | Freq: Four times a day (QID) | INTRAMUSCULAR | Status: DC

## 2018-02-16 MED ORDER — ASPIRIN 81 MG PO CHEW
81.0000 mg | CHEWABLE_TABLET | Freq: Every day | ORAL | Status: DC
  Administered 2018-02-16: 81 mg via ORAL
  Filled 2018-02-16: qty 1

## 2018-02-16 MED ORDER — THIAMINE HCL 100 MG/ML IJ SOLN: 100.0000 mg | Freq: Once | INTRAMUSCULAR | Status: DC

## 2018-02-16 MED ORDER — LORAZEPAM 2 MG/ML IJ SOLN: 0.0000 mg | Freq: Two times a day (BID) | INTRAMUSCULAR | Status: DC

## 2018-02-16 MED ORDER — THIAMINE HCL 100 MG/ML IJ SOLN: 100.0000 mg | Freq: Every day | INTRAMUSCULAR | Status: DC

## 2018-02-16 MED ORDER — VITAMIN B-1 100 MG PO TABS
100.0000 mg | ORAL_TABLET | Freq: Every day | ORAL | Status: DC
  Administered 2018-02-16: 100 mg via ORAL
  Filled 2018-02-16: qty 1

## 2018-02-16 MED ORDER — CHLORDIAZEPOXIDE HCL 25 MG PO CAPS
25.0000 mg | ORAL_CAPSULE | Freq: Four times a day (QID) | ORAL | Status: DC | PRN
  Administered 2018-02-16 – 2018-02-17 (×3): 25 mg via ORAL
  Filled 2018-02-16 (×3): qty 1

## 2018-02-16 MED ORDER — TRAZODONE HCL 100 MG PO TABS
100.0000 mg | ORAL_TABLET | Freq: Every evening | ORAL | Status: DC | PRN
  Filled 2018-02-16 (×4): qty 1

## 2018-02-16 MED ORDER — VITAMIN B-1 100 MG PO TABS
100.0000 mg | ORAL_TABLET | Freq: Every day | ORAL | Status: DC
  Administered 2018-02-17 – 2018-02-19 (×3): 100 mg via ORAL
  Filled 2018-02-16 (×7): qty 1

## 2018-02-16 MED ORDER — ONDANSETRON HCL 4 MG PO TABS: 4.0000 mg | ORAL_TABLET | Freq: Three times a day (TID) | ORAL | Status: DC | PRN

## 2018-02-16 MED ORDER — NITROGLYCERIN 0.4 MG SL SUBL
0.4000 mg | SUBLINGUAL_TABLET | SUBLINGUAL | Status: DC | PRN
  Administered 2018-02-17: 0.4 mg via SUBLINGUAL

## 2018-02-16 MED ORDER — NITROGLYCERIN 0.4 MG SL SUBL: 0.4000 mg | SUBLINGUAL_TABLET | SUBLINGUAL | Status: DC | PRN

## 2018-02-16 MED ORDER — ONDANSETRON 4 MG PO TBDP: 4.0000 mg | ORAL_TABLET | Freq: Four times a day (QID) | ORAL | Status: DC | PRN

## 2018-02-16 MED ORDER — LORAZEPAM 1 MG PO TABS
0.0000 mg | ORAL_TABLET | Freq: Four times a day (QID) | ORAL | Status: DC
  Administered 2018-02-16: 1 mg via ORAL
  Filled 2018-02-16: qty 1

## 2018-02-16 MED ORDER — ATORVASTATIN CALCIUM 10 MG PO TABS
10.0000 mg | ORAL_TABLET | Freq: Every day | ORAL | Status: DC
  Administered 2018-02-17 – 2018-02-18 (×2): 10 mg via ORAL
  Filled 2018-02-16 (×8): qty 1
  Filled 2018-02-16: qty 7

## 2018-02-16 MED ORDER — ASPIRIN EC 81 MG PO TBEC
81.0000 mg | DELAYED_RELEASE_TABLET | Freq: Every day | ORAL | Status: DC
  Administered 2018-02-17 – 2018-02-19 (×3): 81 mg via ORAL
  Filled 2018-02-16 (×7): qty 1
  Filled 2018-02-16: qty 7
  Filled 2018-02-16: qty 1

## 2018-02-16 MED ORDER — METOPROLOL TARTRATE 25 MG PO TABS
37.5000 mg | ORAL_TABLET | Freq: Two times a day (BID) | ORAL | Status: DC
  Administered 2018-02-16: 37.5 mg via ORAL
  Filled 2018-02-16: qty 2

## 2018-02-16 MED ORDER — ALUM & MAG HYDROXIDE-SIMETH 200-200-20 MG/5ML PO SUSP: 30.0000 mL | Freq: Four times a day (QID) | ORAL | Status: DC | PRN

## 2018-02-16 MED ORDER — LORAZEPAM 1 MG PO TABS: 0.0000 mg | ORAL_TABLET | Freq: Two times a day (BID) | ORAL | Status: DC

## 2018-02-16 MED ORDER — TRAZODONE HCL 100 MG PO TABS: 100.0000 mg | ORAL_TABLET | Freq: Every evening | ORAL | Status: DC | PRN

## 2018-02-16 MED ORDER — GABAPENTIN 300 MG PO CAPS
300.0000 mg | ORAL_CAPSULE | Freq: Three times a day (TID) | ORAL | Status: DC
  Filled 2018-02-16 (×6): qty 1

## 2018-02-16 MED ORDER — NICOTINE 21 MG/24HR TD PT24
21.0000 mg | MEDICATED_PATCH | Freq: Every day | TRANSDERMAL | Status: DC
  Administered 2018-02-16: 21 mg via TRANSDERMAL
  Filled 2018-02-16: qty 1

## 2018-02-16 MED ORDER — ADULT MULTIVITAMIN W/MINERALS CH
1.0000 | ORAL_TABLET | Freq: Every day | ORAL | Status: DC
  Administered 2018-02-16 – 2018-02-19 (×4): 1 via ORAL
  Filled 2018-02-16 (×8): qty 1

## 2018-02-16 MED ORDER — QUETIAPINE FUMARATE 25 MG PO TABS
25.0000 mg | ORAL_TABLET | Freq: Every day | ORAL | Status: DC
  Filled 2018-02-16 (×2): qty 1

## 2018-02-16 MED ORDER — SERTRALINE HCL 25 MG PO TABS
25.0000 mg | ORAL_TABLET | Freq: Every day | ORAL | Status: DC
  Filled 2018-02-16 (×3): qty 1

## 2018-02-16 MED ORDER — ALUM & MAG HYDROXIDE-SIMETH 200-200-20 MG/5ML PO SUSP: 30.0000 mL | ORAL | Status: DC | PRN

## 2018-02-16 MED ORDER — MAGNESIUM HYDROXIDE 400 MG/5ML PO SUSP: 30.0000 mL | Freq: Every day | ORAL | Status: DC | PRN

## 2018-02-16 MED ORDER — NICOTINE 21 MG/24HR TD PT24
21.0000 mg | MEDICATED_PATCH | Freq: Every day | TRANSDERMAL | Status: DC
  Administered 2018-02-16: 21 mg via TRANSDERMAL
  Filled 2018-02-16 (×8): qty 1

## 2018-02-16 MED ORDER — QUETIAPINE FUMARATE 50 MG PO TABS
50.0000 mg | ORAL_TABLET | Freq: Every day | ORAL | Status: DC
  Administered 2018-02-16 – 2018-02-17 (×2): 50 mg via ORAL
  Filled 2018-02-16 (×4): qty 1

## 2018-02-16 MED ORDER — ACETAMINOPHEN 325 MG PO TABS
650.0000 mg | ORAL_TABLET | Freq: Four times a day (QID) | ORAL | Status: DC | PRN
  Administered 2018-02-16 – 2018-02-17 (×2): 650 mg via ORAL
  Filled 2018-02-16 (×2): qty 2

## 2018-02-16 MED ORDER — METOPROLOL TARTRATE 25 MG PO TABS
37.5000 mg | ORAL_TABLET | Freq: Two times a day (BID) | ORAL | Status: DC
  Administered 2018-02-16 – 2018-02-19 (×6): 37.5 mg via ORAL
  Filled 2018-02-16: qty 1.5
  Filled 2018-02-16: qty 21
  Filled 2018-02-16 (×2): qty 1.5
  Filled 2018-02-16: qty 2
  Filled 2018-02-16 (×3): qty 1.5
  Filled 2018-02-16: qty 21
  Filled 2018-02-16 (×8): qty 1.5

## 2018-02-16 MED ORDER — MIRTAZAPINE 7.5 MG PO TABS
7.5000 mg | ORAL_TABLET | Freq: Every day | ORAL | Status: DC
  Administered 2018-02-16 – 2018-02-18 (×3): 7.5 mg via ORAL
  Filled 2018-02-16: qty 1
  Filled 2018-02-16: qty 7
  Filled 2018-02-16 (×7): qty 1

## 2018-02-16 NOTE — BH Assessment (Signed)
BHH Assessment Progress Note   Patient accepted to [facility] 302-1 to Dr. Jama Flavors.  Patient can come after 10:00.  Nurse call report to [phone].  Information on disposition given to nurse Candy.  Clinician was unable to get in touch with [name], PA or Dr. Nicanor Alcon.

## 2018-02-16 NOTE — ED Notes (Signed)
Patient denies pain and is resting comfortably.  

## 2018-02-16 NOTE — ED Notes (Signed)
Allowed pt to keep prayer cards with him.
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
## 2018-02-16 NOTE — Progress Notes (Signed)
Patient is a 32 yo caucasian male , well-known to [facility], who is admitted today from [facility] ED 2' relapse from heroin 2' brekup with his GF / resulting in homelessness. He is cooperative and pleasant during admission assessment and denis active SI, saying " I just gotta get my life straight". 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 Interestingly, his UDS is postive for benzodiazepine, but he is adamant that he hasnt taken any ( he did admit to heroin  And pot). He contrqcts for safeyt and is admitted to the unit.

## 2018-02-16 NOTE — ED Provider Notes (Signed)
[facility] EMERGENCY DEPARTMENT Provider Note   CSN: [id number] Arrival date & time: 02/16/18  0102     History   Chief Complaint Chief Complaint  Patient presents with  . Suicidal    HPI Derek Little is a 32 y.o. male with a hx of coronary artery disease, MI, polysubstance abuse, alcohol abuse presents to the Emergency Department complaining of gradual, persistent, progressively worsening suicidal ideation onset 2 days ago.  Patient reports he was hospitalized several weeks ago for similar symptoms.  He reports to discharge he was clean for 2 days and then began using heroin again.  He reports the last week has lost his car, fianc and home.  He reports this has significantly worsened his depression and he feels hopeless.  He states he did not make a specific plan to kill himself.  He denies homicidal ideation, auditory or visual hallucinations.  Patient is tearful and reports that he needs help.  He reports 6-8 bags of heroin today and approximately 4 shots of alcohol.  He reports he is drinking alcohol daily.  Patient denies IV drug use stating that he is snorting the heroin.  No specific alleviating factors.  Patient denies headache, neck pain, chest pain, shortness of breath, abdominal pain, nausea, vomiting, diarrhea, weakness, dizziness, syncope, dysuria, hematuria.  The history is provided by the patient and medical records. No language interpreter was used.    Past Medical History:  Diagnosis Date  . CAD (coronary artery disease)     Patient Active Problem List   Diagnosis Date Noted  . Polysubstance (including opioids) dependence, daily use (HCC) 02/01/2018  . Opioid dependence with opioid-induced mood disorder (HCC)   . Alcohol dependence with alcohol-induced mood disorder (HCC)   . MDD (major depressive disorder), recurrent severe, without psychosis (HCC) 01/31/2018    Past Surgical History:  Procedure Laterality Date  . CARDIAC SURGERY           Home Medications    Prior to Admission medications   Medication Sig Start Date End Date Taking? Authorizing Provider  aspirin 81 MG chewable tablet Chew 81 mg by mouth daily.   Yes [provider]  metoprolol tartrate (LOPRESSOR) 25 MG tablet Take 37.5 mg by mouth 2 (two) times daily.   Yes [provider]  nitroGLYCERIN (NITROSTAT) 0.4 MG SL tablet Place 0.4 mg under the tongue every 5 (five) minutes as needed for chest pain.   Yes [provider]  sertraline (ZOLOFT) 25 MG tablet Take 25 mg by mouth daily. 11/02/17  Yes [provider]  traZODone (DESYREL) 100 MG tablet Take 1 tablet (100 mg total) by mouth at bedtime and may repeat dose one time if needed. 02/03/18  Yes Oneta Rack, NP  gabapentin (NEURONTIN) 300 MG capsule Take 1 capsule (300 mg total) by mouth 3 (three) times daily. 02/03/18   Oneta Rack, NP  nicotine (NICODERM CQ - DOSED IN MG/24 HOURS) 21 mg/24hr patch Place 1 patch (21 mg total) onto the skin daily. Patient not taking: Reported on 02/16/2018 02/03/18   Oneta Rack, NP  QUEtiapine (SEROQUEL) 25 MG tablet Take 1 tablet (25 mg total) by mouth at bedtime. 02/03/18   Oneta Rack, NP    Family History No family history on file.  Social History Social History   Tobacco Use  . Smoking status: Current Every Day Smoker  . Smokeless tobacco: Former Neurosurgeon    Types: Snuff  Substance Use Topics  . Alcohol  use: Yes  . Drug use: Yes    Types: Marijuana    Comment: Heroin /Percocet     Allergies   Patient has no known allergies.   Review of Systems Review of Systems  Constitutional: Negative for appetite change, diaphoresis, fatigue, fever and unexpected weight change.  HENT: Negative for mouth sores.   Eyes: Negative for visual disturbance.  Respiratory: Negative for cough, chest tightness, shortness of breath and wheezing.   Cardiovascular: Negative for chest pain.  Gastrointestinal: Negative for abdominal  pain, constipation, diarrhea, nausea and vomiting.  Endocrine: Negative for polydipsia, polyphagia and polyuria.  Genitourinary: Negative for dysuria, frequency, hematuria and urgency.  Musculoskeletal: Negative for back pain and neck stiffness.  Skin: Negative for rash.  Allergic/Immunologic: Negative for immunocompromised state.  Neurological: Negative for syncope, light-headedness and headaches.  Hematological: Does not bruise/bleed easily.  Psychiatric/Behavioral: Positive for suicidal ideas. Negative for sleep disturbance. The patient is not nervous/anxious.      Physical Exam Updated Vital Signs BP 137/88 (BP Location: Right Arm)   Pulse 86   Temp 98.3 F (36.8 C) (Oral)   Resp 14   SpO2 100%   Physical Exam  Constitutional: He appears well-developed and well-nourished. No distress.  HENT:  Head: Normocephalic.  Eyes: Conjunctivae are normal. No scleral icterus.  Neck: Normal range of motion.  Cardiovascular: Normal rate and intact distal pulses.  Pulmonary/Chest: Effort normal.  Abdominal: Soft. He exhibits no distension.  Musculoskeletal: Normal range of motion.  Neurological: He is alert.  Skin: Skin is warm and dry.  No track marks noted.  Psychiatric: He is not actively hallucinating. He exhibits a depressed mood. He expresses suicidal ideation. He expresses no homicidal ideation. He expresses no suicidal plans and no homicidal plans.  Patient tearful throughout history and physical  Nursing note and vitals reviewed.    ED Treatments / Results  Labs (all labs ordered are listed, but only abnormal results are displayed) Labs Reviewed  COMPREHENSIVE METABOLIC PANEL - Abnormal; Notable for the following components:      Result Value   Total Protein 6.3 (*)    All other components within normal limits  ACETAMINOPHEN LEVEL - Abnormal; Notable for the following components:   Acetaminophen (Tylenol), Serum <10 (*)    All other components within normal limits   RAPID URINE DRUG SCREEN, HOSP PERFORMED - Abnormal; Notable for the following components:   Opiates POSITIVE (*)    Benzodiazepines POSITIVE (*)    Tetrahydrocannabinol POSITIVE (*)    All other components within normal limits  ETHANOL  SALICYLATE LEVEL  CBC    Procedures Procedures (including critical care time)  Medications Ordered in ED Medications  LORazepam (ATIVAN) injection 0-4 mg (has no administration in time range)    Or  LORazepam (ATIVAN) tablet 0-4 mg (has no administration in time range)  LORazepam (ATIVAN) injection 0-4 mg (has no administration in time range)    Or  LORazepam (ATIVAN) tablet 0-4 mg (has no administration in time range)  thiamine (VITAMIN B-1) tablet 100 mg (has no administration in time range)    Or  thiamine (B-1) injection 100 mg (has no administration in time range)  ondansetron (ZOFRAN) tablet 4 mg (has no administration in time range)  alum & mag hydroxide-simeth (MAALOX/MYLANTA) 200-200-20 MG/5ML suspension 30 mL (has no administration in time range)  nicotine (NICODERM CQ - dosed in mg/24 hours) patch 21 mg (has no administration in time range)     Initial Impression / Assessment and Plan /  ED Course  I have reviewed the triage vital signs and the nursing notes.  Pertinent labs & imaging results that were available during my care of the patient were reviewed by me and considered in my medical decision making (see chart for details).  Clinical Course as of Feb 16 505  Fri Feb 16, 2018  0418 UDS positive for opiates, benzos and marijuana.  Opiates(!): POSITIVE [HM]  0418 Noted.  Alcohol, Ethyl (B): <10 [HM]  0418 Afebrile without tachycardia or hypotension.  No hypoxia.  Temp: 98.3 F (36.8 C) [HM]  0506 TTS is completed.  Morning reevaluation recommended.   [HM]    Clinical Course User Index [HM] Mykelti Goldenstein, Boyd Kerbs    Presents to the emergency department with complaints of suicidal ideation and hopelessness.   Additionally, he has a history of polysubstance abuse and has been abusing drugs and alcohol over the last several days.  He was recently hospitalized inpatient at [facility] for similar symptoms.  Labs reviewed and physical exam conducted.  At this time there is no emergent condition for which immediate intervention is required.  Patient will see TTS.  Final Clinical Impressions(s) / ED Diagnoses   Final diagnoses:  Suicidal ideation  Polysubstance abuse [facility])    ED Discharge Orders    None       Declan Mier, Boyd Kerbs 02/16/18 0506    Palumbo, April, MD 02/16/18 [phone]

## 2018-02-16 NOTE — ED Notes (Signed)
Pt accepted to [facility]. Able to be transported after 1000 this am. Breakfast tray ordered.

## 2018-02-16 NOTE — ED Notes (Signed)
Report given, patient signed transfer consent. [facility] arrived to transport patient. All AM meds given including 1 PRN (see mar; accepting RN notified). Patient has his prayer cards on him(accepting RN notified).  All PT belongings given to [facility] staff.

## 2018-02-16 NOTE — H&P (Signed)
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 Severe recurrent major depression without psychotic features ([place]) [F33.2] 02/16/2018  . Polysubstance (including opioids) dependence, daily use ([place]) [F19.20] 02/01/2018  . Opioid dependence with opioid-induced mood disorder ([place]) [F11.24]   . Alcohol dependence with alcohol-induced mood disorder ([place]) [F10.24]   . MDD (major depressive disorder), recurrent severe, without psychosis ([place]) [F33.2] 01/31/2018   History of Present Illness: 32 year old male, known to our unit from prior psychiatric admission from 8/21- 02/03/18. At the time presented for opiate , alcohol dependence and depression. He was diagnosed with Opiate Use Disorder, Opiate Induced Mood Disorder, was discharged . He was discharged on Neurontin, Seroquel, Trazodone. States he stopped psychiatric medications after discharge. Presented to ED voluntarily requesting detox, reporting increased depression and suicidal ideations States " after I left I was good for a few days", but relapsed 1-2 weeks ago on opiates and alcohol. Has been using heroin ( insufflated, not IV) daily, and has been drinking alcohol almost daily, about 4-5 shots per day. States his relationship with GF /mother of her child has deteriorated, and she has not allowed him to see his child, which has contributed to feeling more depressed .   Associated Signs/Symptoms: Depression Symptoms:  depressed mood, anhedonia, insomnia, suicidal thoughts without plan, anxiety, loss of energy/fatigue, decreased appetite, (Hypo) Manic Symptoms: none noted or endorsed  Anxiety Symptoms:  Reports increased anxiety Psychotic  Symptoms:  No psychosis PTSD Symptoms: Denies  Total Time spent with patient: 45 minutes  Past Psychiatric History: reports prior admissions for detox. As noted, had recent psychiatric admission to [facility] in August for opiate, alcohol use disorders and depression. Reports history of depression exacerbated by drug abuse and by psychosocial stressors. Denies history of psychosis, no history of mania, no history of PTSD. Has never attempted suicide, denies history of cutting.   Is the patient at risk to self? Yes.    Has the patient been a risk to self in the past 6 months? Yes.    Has the patient been a risk to self within the distant past? No.  Is the patient a risk to others? No.  Has the patient been a risk to others in the past 6 months? No.  Has the patient been a risk to others within the distant past? No.   Prior Inpatient Therapy:  as above  Prior Outpatient Therapy:  had been referred to [facility] on last discharge.  Alcohol Screening:   Substance Abuse History in the last 12 months:  History of opiate dependence, states he was sober for several years until he relapsed 2 years ago. History of alcohol dependence.  Consequences of Substance Abuse: Denies history of seizures , denies history of blackouts, no history of DUI.  Previous Psychotropic Medications: as noted, was prescribed Seroquel, Trazodone, Neurontin on recent admission. States he stopped medications other than Trazodone, as he felt they were not working and was worried about possible side effects. Reports he tried Zoloft for several weeks but does not feel it worked . States he has not been on other psychiatric medications in the past . 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  Past Medical History:  Diagnosis Date  . CAD (coronary artery disease)     Past Surgical History:  Procedure Laterality Date  . CARDIAC SURGERY     Family History: Parents alive,  separated, he has no relationship with father, mother lives in [place]. Has one brother , one sister, and a half sister aged 38. Family Psychiatric  History: reports both maternal grandparents were alcoholic, no other mental illness or suicides in family  Tobacco Screening:  smokes 1 PPD  Social History: 60, single, has one son, who is currently with the mother, currently homeless, unemployed. Denies legal issues  Social History   Substance and Sexual Activity  Alcohol Use Yes     Social History   Substance and Sexual Activity  Drug Use Yes  . Types: Marijuana   Comment: Heroin /Percocet    Additional Social History:  Allergies:  No Known Allergies Lab Results:  Results for orders placed or performed during the hospital encounter of 02/16/18 (from the past 48 hour(s))  Rapid urine drug screen (hospital performed)     Status: Abnormal   Collection Time: 02/16/18  1:10 AM  Result Value Ref Range   Opiates POSITIVE (A) NONE DETECTED   Cocaine NONE DETECTED NONE DETECTED   Benzodiazepines POSITIVE (A) NONE DETECTED   Amphetamines NONE DETECTED NONE DETECTED   Tetrahydrocannabinol POSITIVE (A) NONE DETECTED   Barbiturates NONE DETECTED NONE DETECTED    Comment: (NOTE) DRUG SCREEN FOR MEDICAL PURPOSES ONLY.  IF CONFIRMATION IS NEEDED FOR ANY PURPOSE, NOTIFY LAB WITHIN 5 DAYS. LOWEST DETECTABLE LIMITS FOR URINE DRUG SCREEN Drug Class                     Cutoff (ng/mL) Amphetamine and metabolites    1000 Barbiturate and metabolites    200 Benzodiazepine                 841 Tricyclics and metabolites     300 Opiates and metabolites        300 Cocaine and metabolites        300 THC                            50 Performed at [facility], [place] [address], [place], [place] [address]   Comprehensive metabolic panel     Status: Abnormal   Collection Time: 02/16/18  1:14 AM  Result Value Ref Range   Sodium 142 135 - 145 mmol/L   Potassium 4.0 3.5 - 5.1 mmol/L   Chloride 105 98  - 111 mmol/L   CO2 27 22 - 32 mmol/L   Glucose, Bld 80 70 - 99 mg/dL   BUN 9 6 - 20 mg/dL   Creatinine, Ser 0.86 0.61 - 1.24 mg/dL   Calcium 9.3 8.9 - 10.3 mg/dL   Total Protein 6.3 (L) 6.5 - 8.1 g/dL   Albumin 3.9 3.5 - 5.0 g/dL   AST 18 15 - 41 U/L   ALT 15 0 - 44 U/L   Alkaline Phosphatase 51 38 - 126 U/L   Total Bilirubin 0.6 0.3 - 1.2 mg/dL   GFR calc non Af Amer >60 >60 mL/min   GFR calc Af Amer >60 >60 mL/min    Comment: (NOTE) The eGFR has been calculated using the CKD EPI equation. This calculation has not been validated in all clinical situations. eGFR's persistently <60 mL/min signify possible Chronic Kidney Disease.  Anion gap 10 5 - 15    Comment: Performed at [place] [facility] [address] [place], [place] [address]  Ethanol     Status: None   Collection Time: 02/16/18  1:14 AM  Result Value Ref Range   Alcohol, Ethyl (B) <10 <10 mg/dL    Comment: (NOTE) Lowest detectable limit for serum alcohol is 10 mg/dL. For medical purposes only. Performed at [facility], [place] [address], [place], [place] [address]   Salicylate level     Status: None   Collection Time: 02/16/18  1:14 AM  Result Value Ref Range   Salicylate Lvl <4.1 2.8 - 30.0 mg/dL    Comment: Performed at [facility] [address] [place], [place] [address]  Acetaminophen level     Status: Abnormal   Collection Time: 02/16/18  1:14 AM  Result Value Ref Range   Acetaminophen (Tylenol), Serum <10 (L) 10 - 30 ug/mL    Comment: (NOTE) Therapeutic concentrations vary significantly. A range of 10-30 ug/mL  may be an effective concentration for many patients. However, some  are best treated at concentrations outside of this range. Acetaminophen concentrations >150 ug/mL at 4 hours after ingestion  and >50 ug/mL at 12 hours after ingestion are often associated with  toxic reactions. Performed at [facility], [place] [address], [place], [place] [address]   cbc     Status: None    Collection Time: 02/16/18  1:14 AM  Result Value Ref Range   WBC 9.4 4.0 - 10.5 K/uL   RBC 4.65 4.22 - 5.81 MIL/uL   Hemoglobin 14.0 13.0 - 17.0 g/dL   HCT 41.4 39.0 - 52.0 %   MCV 89.0 78.0 - 100.0 fL   MCH 30.1 26.0 - 34.0 pg   MCHC 33.8 30.0 - 36.0 g/dL   RDW 12.1 11.5 - 15.5 %   Platelets 223 150 - 400 K/uL    Comment: Performed at [facility], [place] [address] [place], [place] [address]    Blood Alcohol level:  Lab Results  Component Value Date   ETH <10 02/16/2018   ETH <10 97/07/6376    Metabolic Disorder Labs:  No results found for: HGBA1C, MPG No results found for: PROLACTIN No results found for: CHOL, TRIG, HDL, CHOLHDL, VLDL, LDLCALC  Current Medications: Current Facility-Administered Medications  Medication Dose Route Frequency Provider Last Rate Last Dose  . acetaminophen (TYLENOL) tablet 650 mg  650 mg Oral Q6H PRN Rozetta Nunnery, NP      . alum & mag hydroxide-simeth (MAALOX/MYLANTA) 200-200-20 MG/5ML suspension 30 mL  30 mL Oral Q4H PRN Lindon Romp A, NP      . gabapentin (NEURONTIN) capsule 300 mg  300 mg Oral TID Lindon Romp A, NP      . magnesium hydroxide (MILK OF MAGNESIA) suspension 30 mL  30 mL Oral Daily PRN Lindon Romp A, NP      . metoprolol tartrate (LOPRESSOR) tablet 37.5 mg  37.5 mg Oral BID Lindon Romp A, NP      . nicotine (NICODERM CQ - dosed in mg/24 hours) patch 21 mg  21 mg Transdermal Daily Lindon Romp A, NP      . nitroGLYCERIN (NITROSTAT) SL tablet 0.4 mg  0.4 mg Sublingual Q5 min PRN Lindon Romp A, NP      . QUEtiapine (SEROQUEL) tablet 25 mg  25 mg Oral QHS Rozetta Nunnery, NP      .  sertraline (ZOLOFT) tablet 25 mg  25 mg Oral Daily Lindon Romp A, NP      . traZODone (DESYREL) tablet 100 mg  100 mg Oral QHS,MR X 1 Lindon Romp A, NP       PTA Medications: Medications Prior to Admission  Medication Sig Dispense Refill Last Dose  . aspirin 81 MG chewable tablet Chew 81 mg by mouth daily.   02/15/2018 at Unknown time  .  gabapentin (NEURONTIN) 300 MG capsule Take 1 capsule (300 mg total) by mouth 3 (three) times daily. 90 capsule 0   . metoprolol tartrate (LOPRESSOR) 25 MG tablet Take 37.5 mg by mouth 2 (two) times daily.   02/15/2018 at 1800  . nicotine (NICODERM CQ - DOSED IN MG/24 HOURS) 21 mg/24hr patch Place 1 patch (21 mg total) onto the skin daily. (Patient not taking: Reported on 02/16/2018) 28 patch 0 Not Taking at Unknown time  . nitroGLYCERIN (NITROSTAT) 0.4 MG SL tablet Place 0.4 mg under the tongue every 5 (five) minutes as needed for chest pain.   unknown  . QUEtiapine (SEROQUEL) 25 MG tablet Take 1 tablet (25 mg total) by mouth at bedtime. 30 tablet 0   . sertraline (ZOLOFT) 25 MG tablet Take 25 mg by mouth daily.   02/15/2018 at Unknown time  . traZODone (DESYREL) 100 MG tablet Take 1 tablet (100 mg total) by mouth at bedtime and may repeat dose one time if needed. 30 tablet 0 02/15/2018 at Unknown time    Musculoskeletal: Strength & Muscle Tone: within normal limits Gait & Station: normal Patient leans: N/A  Psychiatric Specialty Exam: Physical Exam  Review of Systems  Constitutional: Positive for chills.  HENT:       Rhinorrhea  Eyes: Negative.   Respiratory: Negative.   Cardiovascular: Negative.  Negative for chest pain.  Gastrointestinal: Positive for nausea. Negative for diarrhea and vomiting.  Genitourinary: Negative.   Musculoskeletal: Positive for myalgias.  Neurological: Negative.   Endo/Heme/Allergies: Negative.   Psychiatric/Behavioral: Positive for depression, substance abuse and suicidal ideas.  All other systems reviewed and are negative.   There were no vitals taken for this visit.There is no height or weight on file to calculate BMI.  Vitals are stable- 110/69, pulse 76, no fever  General Appearance: Fairly Groomed  Eye Contact:  Fair  Speech:  Normal Rate  Volume:  Normal  Mood:  Depressed  Affect:  Constricted and tearful at times   Thought Process:  Linear and  Descriptions of Associations: Intact  Orientation:  Full (Time, Place, and Person)  Thought Content:  no hallucinations, no delusions, not internally preoccupied   Suicidal Thoughts:  No denies suicidal or self injurious ideations, contracts for safety on unit , denies homicidal ideations, and also specifically denies any homicidal ideations towards GF  Homicidal Thoughts:  No  Memory:  recent and remote grossly intact   Judgement:  Fair  Insight:  Fair  Psychomotor Activity:  Normal- no psychomotor agitation or restlessness, no current tremors or diaphoresis  Concentration:  Concentration: Good and Attention Span: Good  Recall:  Good  Fund of Knowledge:  Good  Language:  Good  Akathisia:  Negative  Handed:  Right  AIMS (if indicated):     Assets:  Desire for Improvement Resilience  ADL's:  Intact  Cognition:  WNL  Sleep:       Treatment Plan Summary: Daily contact with patient to assess and evaluate symptoms and progress in treatment, Medication management, Plan inpatient treatment and medications  as below  Observation Level/Precautions:  15 minute checks  Laboratory:  as needed   Psychotherapy:  Milieu, group therapy   Medications:  We discussed treatment options Opiate WDL currently mild , will manage with symptomatic relief as needed, will avoid clonidine management due to history of CAD,MI.  Will start Librium detox protocol (PRN) to manage potential alcohol WDL symptoms- he was treated with Librium during recent admission without side effects/ LFTs WNL. As above, states Zoloft did not help. We discussed other options, would start Remeron, which may also help address sleep, which has been poor .  Start Librium detox protocol ( PRN)  Start Remeron 7.5 mgrs QHS  Will resume medications he has been taking for CAD/MI history. Reports he has been taking Atorvastatin ( does not remember dose), ASA, and Lopressor regularly, without side effects.  Consultations: as needed     Discharge Concerns: -homelessness    Estimated LOS: 5 days   Other: patient interested in going to a residential rehab at discharge      Physician Treatment Plan for Primary Diagnosis:  Opiate Use Disorder, Alcohol Use Disorder  Long Term Goal(s): Improvement in symptoms so as ready for discharge  Short Term Goals: Ability to identify triggers associated with substance abuse/mental health issues will improve  Physician Treatment Plan for Secondary Diagnosis: Active Problems:   Severe recurrent major depression without psychotic features ([place])  Long Term Goal(s): Improvement in symptoms so as ready for discharge  Short Term Goals: Ability to identify changes in lifestyle to reduce recurrence of condition will improve, Ability to verbalize feelings will improve, Ability to disclose and discuss suicidal ideas, Ability to demonstrate self-control will improve, Ability to identify and develop effective coping behaviors will improve and Ability to maintain clinical measurements within normal limits will improve  I certify that inpatient services furnished can reasonably be expected to improve the patient's condition.    Jenne Campus, MD 9/6/20195:03 PM

## 2018-02-16 NOTE — Progress Notes (Signed)
D: Pt was at nurse's station upon initial approach.  Pt presents with anxious, depressed affect and mood.  He is tearful at times and reports withdrawal symptoms of "runny nose, cold chills, aches."  He denies having a goal tonight so Clinical research associate and pt made goal for pt to "be safe and sleep well."  Pt denies SI/HI, denies hallucinations, reports generalized pain of 5/10.  Pt was visible in milieu intermittently.    A: Introduced self to pt.  Actively listened to pt and offered support and encouragement. Medications administered per order.  PRN medication administered for anxiety and pain.  Pt continued to report increased anxiety tearfully stating "I don't know what's wrong with me."  Pt requested Seroquel, which he reports he has taken in the past.  On-site provider notified and Seroquel 50 mg PO QHS was ordered and administered.  Positive coping skills encouraged.  Q15 minute safety checks maintained.  R: Pt is safe on the unit.  Pt gradually de-escalated after receiving scheduled Seroquel.  Pt is compliant with medications.  Pt verbally contracts for safety.  Will continue to monitor and assess.

## 2018-02-16 NOTE — B[facility] Suicide Risk Assessment (Signed)
[facility] Admission Suicide Risk Assessment   Nursing information obtained from:   Patient and chart Demographic factors:   32 year old single male, currently homeless, unemployed Current Mental Status:   See below Loss Factors:   Homelessness, unemployment, substance abuse/relapse, states girlfriend will not allow him to see his child Historical Factors:   History of depression, history of opiate and alcohol use disorder Risk Reduction Factors:   Resilience  Total Time spent with patient: 45 minutes Principal Problem: Opiate use disorder, alcohol use disorder, substance-induced mood disorder depressed versus major depression  Diagnosis:   Patient Active Problem List   Diagnosis Date Noted  . Severe recurrent major depression without psychotic features ([facility]) [F33.2] 02/16/2018  . Polysubstance (including opioids) dependence, daily use ([facility]) [F19.20] 02/01/2018  . Opioid dependence with opioid-induced mood disorder ([facility]) [F11.24]   . Alcohol dependence with alcohol-induced mood disorder ([facility]) [F10.24]   . MDD (major depressive disorder), recurrent severe, without psychosis ([facility]) [F33.2] 01/31/2018   Subjective Data:   Continued Clinical Symptoms:    The "Alcohol Use Disorders Identification Test", Guidelines for Use in Primary Care, Second Edition.  World Science writer [facility]). Score between 0-7:  no or low risk or alcohol related problems. Score between 8-15:  moderate risk of alcohol related problems. Score between 16-19:  high risk of alcohol related problems. Score 20 or above:  warrants further diagnostic evaluation for alcohol dependence and treatment.   CLINICAL FACTORS:  32 year old single male, currently homeless. Patient is known to our unit from recent admission in August 2019. History of alcohol and opiate dependencies.  Reports he relapsed on alcohol and heroin 1 to 2 weeks ago.  Has been using heroin daily, alcohol on most days. Presented requesting help with substance  abuse, detox, depression. Patient significant stressors which include homelessness, unemployment, strained relationship with girlfriend/mother of his child. Medical history remarkable for coronary artery disease/MI/CABG in April 2019   Psychiatric Specialty Exam: Physical Exam  ROS  There were no vitals taken for this visit.There is no height or weight on file to calculate BMI.  See admit note MSE   COGNITIVE FEATURES THAT CONTRIBUTE TO RISK:  Closed-mindedness and Loss of executive function    SUICIDE RISK:   Moderate:  Frequent suicidal ideation with limited intensity, and duration, some specificity in terms of plans, no associated intent, good self-control, limited dysphoria/symptomatology, some risk factors present, and identifiable protective factors, including available and accessible social support.  PLAN OF CARE: Patient will be admitted to inpatient psychiatric unit for stabilization and safety. Will provide and encourage milieu participation. Provide medication management and maked adjustments as needed.  We will also provide medication management to minimize risk of withdrawal- will follow daily.    I certify that inpatient services furnished can reasonably be expected to improve the patient's condition.   Craige Cotta, MD 02/16/2018, 5:41 PM

## 2018-02-16 NOTE — ED Triage Notes (Signed)
Pt presents stating he would like to detox from heroin. Pt also reports suicidal thoughts with no specific plan. Pt tearful in triage.

## 2018-02-16 NOTE — ED Notes (Addendum)
Patient belongings inventoried and placed in locker #3 in POD [facility]; valuable to be taken to security and Medications counted and to be taken to [facility]

## 2018-02-16 NOTE — Tx Team (Signed)
Initial Treatment Plan 02/16/2018 1:57 PM Derek Little [id number]    PATIENT STRESSORS: Educational concerns Financial difficulties   PATIENT STRENGTHS: Ability for insight Active sense of humor   PATIENT IDENTIFIED PROBLEMS: MDD  Polysubstance Abuse  S/p MI with CABG            " I gotta get my life straght"  " I don't wanna die"   DISCHARGE CRITERIA:  Ability to meet basic life and health needs Adequate post-discharge living arrangements  PRELIMINARY DISCHARGE PLAN: Attend aftercare/continuing care group  PATIENT/FAMILY INVOLVEMENT: This treatment plan has been presented to and reviewed with the patient, Derek Little, and/or family member, .  The patient and family have been given the opportunity to ask questions and make suggestions.  Rich Brave, RN 02/16/2018, 1:57 PM

## 2018-02-17 MED ORDER — QUETIAPINE FUMARATE 50 MG PO TABS
ORAL_TABLET | ORAL | Status: AC
  Filled 2018-02-17: qty 1

## 2018-02-17 MED ORDER — QUETIAPINE FUMARATE 50 MG PO TABS
50.0000 mg | ORAL_TABLET | Freq: Every evening | ORAL | Status: DC | PRN
  Filled 2018-02-17 (×5): qty 1

## 2018-02-17 MED ORDER — QUETIAPINE FUMARATE 50 MG PO TABS: 50.0000 mg | ORAL_TABLET | Freq: Every evening | ORAL | Status: DC | PRN
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
## 2018-02-17 NOTE — BHH Group Notes (Signed)
BHH Group Notes:  (Nursing/MHT/Case Management/Adjunct)  Date:  02/17/2018  Time:  1:15 PM  Type of Therapy:  Nurse Education  Participation Level:  Active  Participation Quality:  Appropriate  Affect:  Appropriate  Cognitive:  Appropriate  Insight:  Appropriate  Engagement in Group:  Engaged  Modes of Intervention:  Discussion and Education  Summary of Progress/Problems: Nurse led group: Life Skills/Identifying Needs  HARITH EHRLER 02/17/2018, 3:34 PM

## 2018-02-17 NOTE — Progress Notes (Addendum)
[facility] MD Progress Note  02/17/2018 1:27 PM Derek Little  MRN:  [id number]  Subjective: I DON'T HAVE ANYTHING. I LOST EVERYTHING. I been dealing with a lot of abandonment stuff. I had a dream that I was dying, and I was happy that I was dying. I kept having a heart attack in my dream. I woke up and the thoughts were still there. I thought about dying several times.   32 year old male, presented to ED voluntarily requesting treatment for substance use disorder. Recently discharged from our unit on August 24 at the 3 length of stay, patient returns with worsening depression and suicidal ideations after relapsed on opiates and alcohol.  He reports that the relationship between his girlfriend and child has deteriorated and now he feels helpless and hopeless.  Derek Little is seen, chart reviewed. The chart findings discussed with the treatment team. He visible on the unit.  During the evaluation patient presents very disengaged, sad, hopeless, and very tearful unable to complete evaluation in its entirety. He presents with a significant amount of anxiety and psychomotor agitation and restlessness. He continues to endorse a significant amount of depression rating it at 10 out of 10 with 10 being the worst.  During his previous admission upon chart review patient also presented in such manner, however they requested discharge within a couple days. He endorses a significant amount of repetitive and intrusive thoughts about his heart attack and worsening anxiety. This time patient reports he is willing to complete treatment and participating in therapeutic milieu as suggested.  He is open to the detox program after discharge. He denies any SIHI, AVH, delusional thoughts or paranoia. Resumed Derek Little's  Seroquel 24m po qhs, Remeron 7.579mpo qhs, and hydroxyzine.  He has agreed to continue current plan of care already in progress. He denies any SIHI, AVH, delusional thoughts or paranoia.  Principal Problem: Severe  recurrent major depression without psychotic features (HCPound Diagnosis:   Patient Active Problem List   Diagnosis Date Noted  . Severe recurrent major depression without psychotic features ([facility][F33.2] 02/16/2018  . Polysubstance (including opioids) dependence, daily use ([facility][F19.20] 02/01/2018  . Opioid dependence with opioid-induced mood disorder ([facility][F11.24]   . Alcohol dependence with alcohol-induced mood disorder ([facility][F10.24]   . MDD (major depressive disorder), recurrent severe, without psychosis ([facility][F33.2] 01/31/2018   Total Time spent with patient: 25 minutes  Past Psychiatric History: See H&P  Past Medical History:  Past Medical History:  Diagnosis Date  . CAD (coronary artery disease)     Past Surgical History:  Procedure Laterality Date  . CARDIAC SURGERY     Family History: History reviewed. No pertinent family history.  Family Psychiatric  History: See H&P  Social History:  Social History   Substance and Sexual Activity  Alcohol Use Yes     Social History   Substance and Sexual Activity  Drug Use Yes  . Types: Marijuana   Comment: Heroin /Percocet    Social History   Socioeconomic History  . Marital status: Not on file    Spouse name: Not on file  . Number of children: 2  . Years of education: high school  . Highest education level: Not on file  Occupational History  . Not on file  Social Needs  . Financial resource strain: Somewhat hard  . Food insecurity:    Worry: Sometimes true    Inability: Never true  . Transportation needs:    Medical: Yes  Non-medical: Yes  Tobacco Use  . Smoking status: Current Every Day Smoker  . Smokeless tobacco: Former Systems developer    Types: Snuff  Substance and Sexual Activity  . Alcohol use: Yes  . Drug use: Yes    Types: Marijuana    Comment: Heroin /Percocet  . Sexual activity: Yes    Birth control/protection: Condom  Lifestyle  . Physical activity:    Days per week: 0 days    Minutes per  session: 0 min  . Stress: Rather much  Relationships  . Social connections:    Talks on phone: Never    Gets together: Never    Attends religious service: Never    Active member of club or organization: No    Attends meetings of clubs or organizations: Not on file    Relationship status: Living with partner  Other Topics Concern  . Not on file  Social History Narrative  . Not on file   Additional Social History:   Sleep: Fair  Appetite:  Good  Current Medications: Current Facility-Administered Medications  Medication Dose Route Frequency Provider Last Rate Last Dose  . acetaminophen (TYLENOL) tablet 650 mg  650 mg Oral Q6H PRN Lindon Romp A, NP   650 mg at 02/16/18 1930  . alum & mag hydroxide-simeth (MAALOX/MYLANTA) 200-200-20 MG/5ML suspension 30 mL  30 mL Oral Q4H PRN Lindon Romp A, NP      . aspirin EC tablet 81 mg  81 mg Oral Daily Elah Avellino, Myer Peer, MD   81 mg at 02/17/18 0910  . atorvastatin (LIPITOR) tablet 10 mg  10 mg Oral q1800 Carmon Sahli A, MD      . chlordiazePOXIDE (LIBRIUM) capsule 25 mg  25 mg Oral Q6H PRN Shalom Mcguiness, Myer Peer, MD   25 mg at 02/17/18 0936  . gabapentin (NEURONTIN) capsule 100 mg  100 mg Oral TID Camisha Srey, Myer Peer, MD   100 mg at 02/17/18 1213  . hydrOXYzine (ATARAX/VISTARIL) tablet 25 mg  25 mg Oral Q6H PRN Martell Mcfadyen, Myer Peer, MD   25 mg at 02/17/18 0910  . loperamide (IMODIUM) capsule 2-4 mg  2-4 mg Oral PRN Timberlyn Pickford, Myer Peer, MD      . magnesium hydroxide (MILK OF MAGNESIA) suspension 30 mL  30 mL Oral Daily PRN Lindon Romp A, NP      . metoprolol tartrate (LOPRESSOR) tablet 37.5 mg  37.5 mg Oral BID Lindon Romp A, NP   37.5 mg at 02/17/18 0912  . mirtazapine (REMERON) tablet 7.5 mg  7.5 mg Oral QHS Ignatz Deis, Myer Peer, MD   7.5 mg at 02/16/18 2101  . multivitamin with minerals tablet 1 tablet  1 tablet Oral Daily Keyanna Sandefer, Myer Peer, MD   1 tablet at 02/17/18 0910  . nicotine (NICODERM CQ - dosed in mg/24 hours) patch 21 mg  21 mg Transdermal  Daily Lindon Romp A, NP   Stopped at 02/17/18 0915  . nitroGLYCERIN (NITROSTAT) SL tablet 0.4 mg  0.4 mg Sublingual Q5 min PRN Lindon Romp A, NP      . QUEtiapine (SEROQUEL) tablet 50 mg  50 mg Oral QHS Patriciaann Clan E, PA-C   50 mg at 02/16/18 2101  . thiamine (B-1) injection 100 mg  100 mg Intramuscular Once Yoshiharu Brassell A, MD      . thiamine (VITAMIN B-1) tablet 100 mg  100 mg Oral Daily Erique Kaser, Myer Peer, MD   100 mg at 02/17/18 0141   Lab Results:  Results for orders placed or  performed during the hospital encounter of 02/16/18 (from the past 48 hour(s))  Rapid urine drug screen (hospital performed)     Status: Abnormal   Collection Time: 02/16/18  1:10 AM  Result Value Ref Range   Opiates POSITIVE (A) NONE DETECTED   Cocaine NONE DETECTED NONE DETECTED   Benzodiazepines POSITIVE (A) NONE DETECTED   Amphetamines NONE DETECTED NONE DETECTED   Tetrahydrocannabinol POSITIVE (A) NONE DETECTED   Barbiturates NONE DETECTED NONE DETECTED    Comment: (NOTE) DRUG SCREEN FOR MEDICAL PURPOSES ONLY.  IF CONFIRMATION IS NEEDED FOR ANY PURPOSE, NOTIFY LAB WITHIN 5 DAYS. LOWEST DETECTABLE LIMITS FOR URINE DRUG SCREEN Drug Class                     Cutoff (ng/mL) Amphetamine and metabolites    1000 Barbiturate and metabolites    200 Benzodiazepine                 220 Tricyclics and metabolites     300 Opiates and metabolites        300 Cocaine and metabolites        300 THC                            50 Performed at [facility], [place] [address], [place], [place] [address]   Comprehensive metabolic panel     Status: Abnormal   Collection Time: 02/16/18  1:14 AM  Result Value Ref Range   Sodium 142 135 - 145 mmol/L   Potassium 4.0 3.5 - 5.1 mmol/L   Chloride 105 98 - 111 mmol/L   CO2 27 22 - 32 mmol/L   Glucose, Bld 80 70 - 99 mg/dL   BUN 9 6 - 20 mg/dL   Creatinine, Ser 0.86 0.61 - 1.24 mg/dL   Calcium 9.3 8.9 - 10.3 mg/dL   Total Protein 6.3 (L) 6.5 - 8.1 g/dL    Albumin 3.9 3.5 - 5.0 g/dL   AST 18 15 - 41 U/L   ALT 15 0 - 44 U/L   Alkaline Phosphatase 51 38 - 126 U/L   Total Bilirubin 0.6 0.3 - 1.2 mg/dL   GFR calc non Af Amer >60 >60 mL/min   GFR calc Af Amer >60 >60 mL/min    Comment: (NOTE) The eGFR has been calculated using the CKD EPI equation. This calculation has not been validated in all clinical situations. eGFR's persistently <60 mL/min signify possible Chronic Kidney Disease.    Anion gap 10 5 - 15    Comment: Performed at [facility] [address], [place], [place] [address]  Ethanol     Status: None   Collection Time: 02/16/18  1:14 AM  Result Value Ref Range   Alcohol, Ethyl (B) <10 <10 mg/dL    Comment: (NOTE) Lowest detectable limit for serum alcohol is 10 mg/dL. For medical purposes only. Performed at [facility], [place] [address] [place], [place] [address]   Salicylate level     Status: None   Collection Time: 02/16/18  1:14 AM  Result Value Ref Range   Salicylate Lvl <5.1 2.8 - 30.0 mg/dL    Comment: Performed at [facility] [address], [place], [place] [address]  Acetaminophen level     Status: Abnormal   Collection Time: 02/16/18  1:14 AM  Result Value Ref Range   Acetaminophen (Tylenol), Serum <10 (L) 10 -  30 ug/mL    Comment: (NOTE) Therapeutic concentrations vary significantly. A range of 10-30 ug/mL  may be an effective concentration for many patients. However, some  are best treated at concentrations outside of this range. Acetaminophen concentrations >150 ug/mL at 4 hours after ingestion  and >50 ug/mL at 12 hours after ingestion are often associated with  toxic reactions. Performed at [facility], [place] [address], [place], [place] [address]   cbc     Status: None   Collection Time: 02/16/18  1:14 AM  Result Value Ref Range   WBC 9.4 4.0 - 10.5 K/uL   RBC 4.65 4.22 - 5.81 MIL/uL   Hemoglobin 14.0 13.0 - 17.0 g/dL   HCT 41.4 39.0 - 52.0 %   MCV 89.0 78.0 - 100.0 fL    MCH 30.1 26.0 - 34.0 pg   MCHC 33.8 30.0 - 36.0 g/dL   RDW 12.1 11.5 - 15.5 %   Platelets 223 150 - 400 K/uL    Comment: Performed at [facility], [place] [address] [place], [place] [address]    Blood Alcohol level:  Lab Results  Component Value Date   ETH <10 02/16/2018   ETH <10 81/85/6314   Metabolic Disorder Labs: No results found for: HGBA1C, MPG No results found for: PROLACTIN No results found for: CHOL, TRIG, HDL, CHOLHDL, VLDL, LDLCALC  Physical Findings: AIMS: Facial and Oral Movements Muscles of Facial Expression: None, normal Lips and Perioral Area: None, normal Jaw: None, normal Tongue: None, normal,Extremity Movements Upper (arms, wrists, hands, fingers): None, normal Lower (legs, knees, ankles, toes): None, normal, Trunk Movements Neck, shoulders, hips: None, normal, Overall Severity Severity of abnormal movements (highest score from questions above): None, normal Incapacitation due to abnormal movements: None, normal Patient's awareness of abnormal movements (rate only patient's report): No Awareness, Dental Status Current problems with teeth and/or dentures?: No Does patient usually wear dentures?: No  CIWA:  CIWA-Ar Total: 2 COWS:  COWS Total Score: 8  Musculoskeletal: Strength & Muscle Tone: within normal limits Gait & Station: normal Patient leans: N/A  Psychiatric Specialty Exam: Physical Exam  Nursing note and vitals reviewed.   Review of Systems  Psychiatric/Behavioral: Positive for depression and substance abuse (Opioid use disorder). Negative for hallucinations, memory loss and suicidal ideas. The patient is nervous/anxious and has insomnia.     Blood pressure 111/81, pulse 96, height '5\' 8"'  (1.727 m), weight 70.8 kg, SpO2 100 %.Body mass index is 23.72 kg/m.  General Appearance: Fairly Groomed  Eye Contact:  Good  Speech:  Normal Rate  Volume:  Normal  Mood:  depressed, anxious   Affect:  reactive,tearful at times, anxious   Thought Process:  Linear and Descriptions of Associations: Intact  Orientation:  Full (Time, Place, and Person)  Thought Content:  no hallucinations, no delusions, not internally preoccupied   Suicidal Thoughts:  No denies suicidal or self injurious ideations, denies homicidal or violent ideations  Homicidal Thoughts:  No  Memory:  recent and remote grossly intact   Judgement:  Fair  Insight:  Fair  Psychomotor Activity:  increased and restlesseness  Concentration:  Concentration: Good and Attention Span: Good  Recall:  Good  Fund of Knowledge:  Good  Language:  Good  Akathisia:  Negative  Handed:  Right  AIMS (if indicated):     Assets:  Desire for Improvement Resilience  ADL's:  Intact  Cognition:  WNL  Sleep:  Number of Hours: 4.75     Treatment Plan  Summary: Daily contact with patient to assess and evaluate symptoms and progress in treatment.  - Continue inpatient hospitalization.  - Will continue today 02/17/2018 plan as below except where it is noted.  Substance withdrawal symptoms.   - Continue the Librium detox protocols.  Agitation.   - Continue rEMERON 7.5MG PO QHS  Depression.    - Resumed REMERON 7.5 mg po daily.  Insomnia.    - Continue Trazodone 100 mg po Q hs prn, may repeat x 1.  Other medical issues.   - Continue ASA 325 mg po daily for heart health.   - Continue NTG 0.4 mg po Q 5 minutes prn for chest pain.   - Continue Lipitor 40 mg po daily for high cholesterol.  - Patient to continue to attend & participate in the group counseling sessions.  - Discharge disposition ongoing.  Nanci Pina, FNP,BC. 02/17/2018, 1:27 PM   ..Agree with NP Progress Note

## 2018-02-17 NOTE — [facility] Suicide Risk Assessment (Signed)
[facility] INPATIENT:  Family/Significant Other Suicide Prevention Education  Suicide Prevention Education:  Contact Attempts: Mother [place] Party [phone], (name of family member/significant other) has been identified by the patient as the family member/significant other with whom the patient will be residing, and identified as the person(s) who will aid the patient in the event of a mental health crisis.  With written consent from the patient, two attempts were made to provide suicide prevention education, prior to and/or following the patient's discharge.  We were unsuccessful in providing suicide prevention education.  A suicide education pamphlet was given to the patient to share with family/significant other.  Date and time of first attempt: 02/17/18 at 10:20 am  Date and time of second attempt: TBD  Shellia Cleverly 02/17/2018, 10:20 AM

## 2018-02-17 NOTE — [facility] Group Notes (Signed)
LCSW Group Therapy Note  02/17/2018    10:00-11:00am   Type of Therapy and Topic:  Group Therapy: Anger and Coping Skills  Participation Level:  Active   Description of Group:   In this group, patients learned how to recognize the physical, cognitive, emotional, and behavioral responses they have to anger-provoking situations.  They identified how they usually or often react when angered, and learned how healthy and unhealthy coping skills work initially, but the unhealthy ones stop working.   They analyzed how their frequently-chosen coping skill is possibly beneficial and how it is possibly unhelpful.  The group discussed a variety of healthier coping skills that could help in resolving the actual issues, as well as how to go about planning for the the possibility of future similar situations.  Therapeutic Goals: 1. Patients will identify one thing that makes them angry and how they feel emotionally and physically, what their thoughts are or tend to be in those situations, and what healthy or unhealthy coping mechanism they typically use 2. Patients will identify how their coping technique works for them, as well as how it works against them. 3. Patients will explore possible new behaviors to use in future anger situations. 4. Patients will learn that anger itself is normal and cannot be eliminated, and that healthier coping skills can assist with resolving conflict rather than worsening situations.  Summary of Patient Progress:  The patient shared that he was "angry/pissed" a few seconds ago when an MHT from [place] had an attitude with him about giving him a breakfast tray, and he reacted by talking back to the person and giving him "attitude" back.  The pt was tearful numerous times during group and said that his heart attack and double bypass surgery 3 months ago has started him thinking multiple times daily about dying.  He is angry with the mothers of his children that they will not allow  him to see his children and stepchildren currently.  He does not understand why, because he feels he has done nothing wrong.  He was able to acknowledge that at the same time he has feelings that may not be so pleasant, so do those other parties, and this could impact their ability to understand each other.  He was encouraged to give himself time to heal from the surgery and to realize it could be having a continued effect on his mood even now.  Therapeutic Modalities:   Cognitive Behavioral Therapy Motivation Interviewing  Lynnell Chad  .

## 2018-02-17 NOTE — Plan of Care (Signed)
  Problem: Education: Goal: Emotional status will improve Outcome: Not Progressing   

## 2018-02-17 NOTE — Progress Notes (Signed)
Pt had pen with metal spring in it when he came to medication window.  Writer requested pen and pt became argumentative but gave Clinical research associate the pen.  He asked why he couldn't have the pen, reporting staff has seen him with it all day.  He states "there's plenty of other things in here you could hurt someone with."  Writer asked for an example and pt stated "the plastic shampoo bottles.  What?  You don't believe me?"  Pt was informed that the pen is contraband and he will be provided with a pen that is allowed.  He continued to verbalize his frustration over not being able to have the pen he gave to Clinical research associate.  Writer explained to pt that safety is the priority for all patients and staff in the hospital and that is why he can't have certain types of pens.  He was specifically asked if he has plans to hurt self or others and he states "no, you don't ever have to ask me that."  Pt denies SI/HI, hallucinations, and pain.  He verbally contracts for safety.  Pt testing boundaries with staff and attempting to staff split.  Redirected as needed.  Pt denies further needs and concerns at this time.  Will continue to monitor and assess.  Pen was locked in locker 46 with the rest of his belongings.  Charge nurse aware.

## 2018-02-18 ENCOUNTER — Encounter (HOSPITAL_COMMUNITY): Payer: Self-pay | Admitting: Emergency Medicine

## 2018-02-18 ENCOUNTER — Inpatient Hospital Stay (HOSPITAL_COMMUNITY): Payer: Medicaid Other

## 2018-02-18 DIAGNOSIS — F17210 Nicotine dependence, cigarettes, uncomplicated: Secondary | ICD-10-CM

## 2018-02-18 DIAGNOSIS — R450 Nervousness: Secondary | ICD-10-CM

## 2018-02-18 DIAGNOSIS — R45851 Suicidal ideations: Secondary | ICD-10-CM

## 2018-02-18 DIAGNOSIS — Z79899 Other long term (current) drug therapy: Secondary | ICD-10-CM

## 2018-02-18 DIAGNOSIS — F1190 Opioid use, unspecified, uncomplicated: Secondary | ICD-10-CM

## 2018-02-18 DIAGNOSIS — G4700 Insomnia, unspecified: Secondary | ICD-10-CM

## 2018-02-18 DIAGNOSIS — F332 Major depressive disorder, recurrent severe without psychotic features: Principal | ICD-10-CM

## 2018-02-18 LAB — BASIC METABOLIC PANEL
Anion gap: 9 (ref 5–15)
BUN: 12 mg/dL (ref 6–20)
CHLORIDE: 105 mmol/L (ref 98–111)
CO2: 26 mmol/L (ref 22–32)
CREATININE: 0.89 mg/dL (ref 0.61–1.24)
Calcium: 9 mg/dL (ref 8.9–10.3)
GFR calc Af Amer: >60 mL/min (ref >60)
GFR calc non Af Amer: >60 mL/min (ref >60)
GLUCOSE: 106 mg/dL — AB (ref 70–99)
Potassium: 3.5 mmol/L (ref 3.5–5.1)
SODIUM: 140 mmol/L (ref 135–145)

## 2018-02-18 LAB — CBC
HCT: 40.4 % (ref 39.0–52.0)
Hemoglobin: 13.6 g/dL (ref 13.0–17.0)
MCH: 29.6 pg (ref 26.0–34.0)
MCHC: 33.7 g/dL (ref 30.0–36.0)
MCV: 87.8 fL (ref 78.0–100.0)
PLATELETS: 215 10*3/uL (ref 150–400)
RBC: 4.6 MIL/uL (ref 4.22–5.81)
RDW: 12.2 % (ref 11.5–15.5)
WBC: 8.9 10*3/uL (ref 4.0–10.5)

## 2018-02-18 LAB — I-STAT TROPONIN, ED
TROPONIN I, POC: 0.03 ng/mL (ref 0.00–0.08)
Troponin i, poc: 0.02 ng/mL (ref 0.00–0.08)

## 2018-02-18 MED ORDER — HYDROXYZINE HCL 50 MG PO TABS
50.0000 mg | ORAL_TABLET | Freq: Three times a day (TID) | ORAL | Status: DC | PRN
  Administered 2018-02-18 (×2): 50 mg via ORAL
  Filled 2018-02-18 (×4): qty 1

## 2018-02-18 MED ORDER — TRAZODONE HCL 100 MG PO TABS
100.0000 mg | ORAL_TABLET | Freq: Every evening | ORAL | Status: DC | PRN
  Administered 2018-02-18 (×2): 100 mg via ORAL
  Filled 2018-02-18 (×10): qty 1

## 2018-02-18 MED ORDER — TRAZODONE HCL 100 MG PO TABS
100.0000 mg | ORAL_TABLET | Freq: Once | ORAL | Status: AC
  Filled 2018-02-18: qty 1

## 2018-02-18 MED ORDER — HYDROCORTISONE 1 % EX CREA
TOPICAL_CREAM | Freq: Three times a day (TID) | CUTANEOUS | Status: DC
  Administered 2018-02-18: 20:00:00 via TOPICAL
  Filled 2018-02-18 (×2): qty 28

## 2018-02-18 MED ORDER — LORAZEPAM 1 MG PO TABS
2.0000 mg | ORAL_TABLET | Freq: Once | ORAL | Status: AC
  Administered 2018-02-18: 2 mg via ORAL
  Filled 2018-02-18: qty 2

## 2018-02-18 NOTE — ED Triage Notes (Addendum)
  Patient BIB [facility] from [facility] with chest pain.  Patient states he was getting ready for bed and started feeling anxious.  He went to his room and tried to sleep but was unable to rest. He got up and started having chest tightness in his mid chest.  Patient took 1 nitro and pain resolved within 1 minute.  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  Patient states he does feel SOB but has been feeling that way for a month.  No N/V.  Patient states he is pain free currently.  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  A&O x4.

## 2018-02-18 NOTE — Progress Notes (Signed)
Pt returned from ED.  He is ambulatory, alert and oriented X4, and in no distress.  Somatically preoccupied upon return, expressing fear of "bed bugs" because he has 3 small raised red areas on his R lower leg.  Pt was encouraged to discuss with provider.  He is safe on the unit.  Will continue to monitor and assess.

## 2018-02-18 NOTE — Progress Notes (Addendum)
Pt requested additional dose of Seroquel 50 mg PO.  On-site provider notified and order was placed.  When writer went to pt's room, pt was standing next to his bed and he reported having chest pain of 5/10.  He reported L arm numbness as his left hand was clenched.  Writer administered nitroglycerin 0.4 mg sublingual and chest pain resolved in 2 minutes.  No sweats, no shortness of breath, no nausea/vomiting.  Pt ambulatory and vitals stable.  Pt states "I just feel weird, I think it was anxiety."  EKG performed.  During EKG, pt asked "do you think I can still get that Seroquel after this?"  Pt was informed on-call provider would have to approve of any further medication administration.  Pt reports history of MI in 09/2017.  EKG reviewed with on-call provider.  Order for pt to go to [facility] via EMS.  Pt denies needs and concerns at time of transfer.  Charge nurse and [facility] informed.  Report called to ED charge nurse.    02/17/18 2351 02/17/18 2352  Vital Signs  Pulse Rate 78 84  Pulse Rate Source Dinamap Dinamap  BP 124/88 115/79  BP Location Right Arm Right Arm  BP Method Automatic Automatic  Patient Position (if appropriate) Sitting Standing

## 2018-02-18 NOTE — [facility] Group Notes (Signed)
[facility] LCSW Group Therapy Note  02/18/2018  10:00-11:00AM  Type of Therapy and Topic:  Group Therapy:  Being Your Own Support  Participation Level:  Active   Description of Group:  Patients in this group were introduced to the concept that self-support is an essential part of recovery.  A song entitled "My Own Hero" was played and a group discussion ensued in which patients stated they could relate to the song and it inspired them to realize they have be willing to help themselves in order to succeed, because other people cannot achieve sobriety or stability for them.  We discussed adding a variety of healthy supports to address the various needs in their lives.  A song was played called "I Know Where I've Been" toward the end of group and used to conduct an inspirational wrap-up to group of remembering how far they have already come in their journey.  Therapeutic Goals: 1)  demonstrate the importance of being a part of one's own support system 2)  discuss reasons people in one's life may eventually be unable to be continually supportive  3)  identify the patient's current support system and   4)  elicit commitments to add healthy supports and to become more conscious of being self-supportive   Summary of Patient Progress:  The patient expressed a great deal of anger initially and said he did not want to participate in group.  He did not talk for awhile, until the first song was played and then he said it made him very angry because his wife should be supporting him, since she promised to be with him forever.  He asked for another song to be played, then a second, and because the group was small and others were okay with it, we did this.  He started interacting then and was able to verbalize that he knows Narcotics Anonymous has been his support in the past that helped him to be sober for 8 years.  He expressed anger at his body for letting him down by having the heart attack.  He was able to acknowledge  by the end of group that he is further down the road now toward once again being sober than he was just a few days ago.  Therapeutic Modalities:   Motivational Interviewing Activity  Lynnell Chad

## 2018-02-18 NOTE — Progress Notes (Addendum)
[facility] MD Progress Note  02/18/2018 12:28 PM Lenus Trauger  MRN:  [id number]  Subjective:My mom just called me and told me my cousin died at 34 years old from leukemia. She left behind 4 kids. Now my mom has to go through this with my cousin and my shit.   32 year old male, presented to ED voluntarily requesting treatment for substance use disorder. Recently discharged from our unit on August 24 at the 3 length of stay, patient returns with worsening depression and suicidal ideations after relapsed on opiates and alcohol.  He reports that the relationship between his girlfriend and child has deteriorated and now he feels helpless and hopeless.  Maalik is seen, chart reviewed. The chart findings discussed with the treatment team. He visible on the unit. He is observed pacing the halls with tangential speech and irritable. He is focused on wanting to leave however he is endorsing some insight on being accepted to [facility]. Today he is observed with increasing agitation and restlessness. He presents with a flight of ideas including going to work at a new job, going to [employer], or discharging from the hospital.  He continues to endorse a significant amount of depression rating it at 10 out of 10 with 10 being the worst.  He continues to endorses a significant amount of repetitive and intrusive thoughts about his heart attack and worsening anxiety. He denies any SIHI, AVH, delusional thoughts or paranoia. Due to his ongoing ruminations about his heart attack and medications will discontinue his Seroquel at this time to prevent prolong QTC. Will start Trazodone to help sleep.  He has agreed to continue current plan of care already in progress. He denies any SIHI, AVH, delusional thoughts or paranoia.  Principal Problem: Severe recurrent major depression without psychotic features ([place])  Diagnosis:   Patient Active Problem List   Diagnosis Date Noted  . Severe recurrent major depression without  psychotic features ([place]) [F33.2] 02/16/2018  . Polysubstance (including opioids) dependence, daily use ([place]) [F19.20] 02/01/2018  . Opioid dependence with opioid-induced mood disorder ([place]) [F11.24]   . Alcohol dependence with alcohol-induced mood disorder ([place]) [F10.24]   . MDD (major depressive disorder), recurrent severe, without psychosis ([place]) [F33.2] 01/31/2018   Total Time spent with patient: 25 minutes  Past Psychiatric History: See H&P  Past Medical History:  Past Medical History:  Diagnosis Date  . CAD (coronary artery disease)     Past Surgical History:  Procedure Laterality Date  . CARDIAC SURGERY     Family History: History reviewed. No pertinent family history.  Family Psychiatric  History: See H&P  Social History:  Social History   Substance and Sexual Activity  Alcohol Use Yes     Social History   Substance and Sexual Activity  Drug Use Yes  . Types: Marijuana   Comment: Heroin /Percocet    Social History   Socioeconomic History  . Marital status: Single    Spouse name: Not on file  . Number of children: 2  . Years of education: high school  . Highest education level: Not on file  Occupational History  . Not on file  Social Needs  . Financial resource strain: Somewhat hard  . Food insecurity:    Worry: Sometimes true    Inability: Never true  . Transportation needs:    Medical: Yes    Non-medical: Yes  Tobacco Use  . Smoking status: Current Every Day Smoker  . Smokeless tobacco: Former Systems developer  Types: Snuff  Substance and Sexual Activity  . Alcohol use: Yes  . Drug use: Yes    Types: Marijuana    Comment: Heroin /Percocet  . Sexual activity: Yes    Birth control/protection: Condom  Lifestyle  . Physical activity:    Days per week: 0 days    Minutes per session: 0 min  . Stress: Rather much  Relationships  . Social connections:    Talks on phone: Never    Gets together: Never    Attends religious service: Never    Active member  of club or organization: No    Attends meetings of clubs or organizations: Not on file    Relationship status: Living with partner  Other Topics Concern  . Not on file  Social History Narrative  . Not on file   Additional Social History:   Sleep: Fair  Appetite:  Good  Current Medications: Current Facility-Administered Medications  Medication Dose Route Frequency Provider Last Rate Last Dose  . acetaminophen (TYLENOL) tablet 650 mg  650 mg Oral Q6H PRN Lindon Romp A, NP   650 mg at 02/17/18 2340  . alum & mag hydroxide-simeth (MAALOX/MYLANTA) 200-200-20 MG/5ML suspension 30 mL  30 mL Oral Q4H PRN Lindon Romp A, NP      . aspirin EC tablet 81 mg  81 mg Oral Daily Kollyn Lingafelter, Myer Peer, MD   81 mg at 02/18/18 0901  . atorvastatin (LIPITOR) tablet 10 mg  10 mg Oral q1800 Annaliah Rivenbark, Myer Peer, MD   10 mg at 02/17/18 1735  . chlordiazePOXIDE (LIBRIUM) capsule 25 mg  25 mg Oral Q6H PRN Minetta Krisher, Myer Peer, MD   25 mg at 02/17/18 2340  . gabapentin (NEURONTIN) capsule 100 mg  100 mg Oral TID Guenevere Roorda, Myer Peer, MD   100 mg at 02/18/18 1211  . hydrOXYzine (ATARAX/VISTARIL) tablet 50 mg  50 mg Oral TID PRN Nanci Pina, FNP      . loperamide (IMODIUM) capsule 2-4 mg  2-4 mg Oral PRN Romanda Turrubiates, Myer Peer, MD      . magnesium hydroxide (MILK OF MAGNESIA) suspension 30 mL  30 mL Oral Daily PRN Lindon Romp A, NP      . metoprolol tartrate (LOPRESSOR) tablet 37.5 mg  37.5 mg Oral BID Lindon Romp A, NP   37.5 mg at 02/18/18 0901  . mirtazapine (REMERON) tablet 7.5 mg  7.5 mg Oral QHS Tierre Gerard, Myer Peer, MD   7.5 mg at 02/17/18 2122  . multivitamin with minerals tablet 1 tablet  1 tablet Oral Daily Corey Laski, Myer Peer, MD   1 tablet at 02/18/18 0901  . nicotine (NICODERM CQ - dosed in mg/24 hours) patch 21 mg  21 mg Transdermal Daily Lindon Romp A, NP   Stopped at 02/17/18 0915  . nitroGLYCERIN (NITROSTAT) SL tablet 0.4 mg  0.4 mg Sublingual Q5 min PRN Lindon Romp A, NP   0.4 mg at 02/17/18 2351  . thiamine  (B-1) injection 100 mg  100 mg Intramuscular Once Daisha Filosa A, MD      . thiamine (VITAMIN B-1) tablet 100 mg  100 mg Oral Daily Anastacia Reinecke, Myer Peer, MD   100 mg at 02/18/18 0902  . traZODone (DESYREL) tablet 100 mg  100 mg Oral QHS,MR X 1 Starkes, Takia S, FNP       Lab Results:  Results for orders placed or performed during the hospital encounter of 02/16/18 (from the past 48 hour(s))  Basic metabolic panel  Status: Abnormal   Collection Time: 02/18/18  1:00 AM  Result Value Ref Range   Sodium 140 135 - 145 mmol/L   Potassium 3.5 3.5 - 5.1 mmol/L   Chloride 105 98 - 111 mmol/L   CO2 26 22 - 32 mmol/L   Glucose, Bld 106 (H) 70 - 99 mg/dL   BUN 12 6 - 20 mg/dL   Creatinine, Ser 0.89 0.61 - 1.24 mg/dL   Calcium 9.0 8.9 - 10.3 mg/dL   GFR calc non Af Amer >60 >60 mL/min   GFR calc Af Amer >60 >60 mL/min    Comment: (NOTE) The eGFR has been calculated using the CKD EPI equation. This calculation has not been validated in all clinical situations. eGFR's persistently <60 mL/min signify possible Chronic Kidney Disease.    Anion gap 9 5 - 15    Comment: Performed at [facility] [address], [place] [address]  CBC     Status: None   Collection Time: 02/18/18  1:00 AM  Result Value Ref Range   WBC 8.9 4.0 - 10.5 K/uL   RBC 4.60 4.22 - 5.81 MIL/uL   Hemoglobin 13.6 13.0 - 17.0 g/dL   HCT 40.4 39.0 - 52.0 %   MCV 87.8 78.0 - 100.0 fL   MCH 29.6 26.0 - 34.0 pg   MCHC 33.7 30.0 - 36.0 g/dL   RDW 12.2 11.5 - 15.5 %   Platelets 215 150 - 400 K/uL    Comment: Performed at [facility], [place] [address], [place], [place] [address]  I-stat troponin, ED     Status: None   Collection Time: 02/18/18  1:23 AM  Result Value Ref Range   Troponin i, poc 0.03 0.00 - 0.08 ng/mL   Comment 3            Comment: Due to the release kinetics of cTnI, a negative result within the first hours of the onset of symptoms does not rule out myocardial infarction with  certainty. If myocardial infarction is still suspected, repeat the test at appropriate intervals.   I-Stat Troponin, ED (not at [facility])     Status: None   Collection Time: 02/18/18  4:31 AM  Result Value Ref Range   Troponin i, poc 0.02 0.00 - 0.08 ng/mL   Comment 3            Comment: Due to the release kinetics of cTnI, a negative result within the first hours of the onset of symptoms does not rule out myocardial infarction with certainty. If myocardial infarction is still suspected, repeat the test at appropriate intervals.     Blood Alcohol level:  Lab Results  Component Value Date   ETH <10 02/16/2018   ETH <10 59/56/3875   Metabolic Disorder Labs: No results found for: HGBA1C, MPG No results found for: PROLACTIN No results found for: CHOL, TRIG, HDL, CHOLHDL, VLDL, LDLCALC  Physical Findings: AIMS: Facial and Oral Movements Muscles of Facial Expression: None, normal Lips and Perioral Area: None, normal Jaw: None, normal Tongue: None, normal,Extremity Movements Upper (arms, wrists, hands, fingers): None, normal Lower (legs, knees, ankles, toes): None, normal, Trunk Movements Neck, shoulders, hips: None, normal, Overall Severity Severity of abnormal movements (highest score from questions above): None, normal Incapacitation due to abnormal movements: None, normal Patient's awareness of abnormal movements (rate only patient's report): No Awareness, Dental Status Current problems with teeth and/or dentures?: No Does patient usually wear dentures?: No  CIWA:  CIWA-Ar  Total: 2 COWS:  COWS Total Score: 8  Musculoskeletal: Strength & Muscle Tone: within normal limits Gait & Station: normal Patient leans: N/A  Psychiatric Specialty Exam: Physical Exam  Nursing note and vitals reviewed.   Review of Systems  Psychiatric/Behavioral: Positive for depression and substance abuse (Opioid use disorder). Negative for hallucinations, memory loss and suicidal ideas. The patient  is nervous/anxious and has insomnia.     Blood pressure 119/87, pulse 74, temperature 98.7 F (37.1 C), temperature source Oral, resp. rate 16, height '5\' 8"'  (1.727 m), weight 70.8 kg, SpO2 99 %.Body mass index is 23.72 kg/m.  General Appearance: Fairly Groomed  Eye Contact:  Good  Speech:  Normal Rate  Volume:  Normal  Mood:  depressed, anxious   Affect:  reactive,tearful at times, anxious  Thought Process:  Linear and Descriptions of Associations: Intact  Orientation:  Full (Time, Place, and Person)  Thought Content:  no hallucinations, no delusions, not internally preoccupied   Suicidal Thoughts:  No denies suicidal or self injurious ideations, denies homicidal or violent ideations  Homicidal Thoughts:  No  Memory:  recent and remote grossly intact   Judgement:  Fair  Insight:  Fair  Psychomotor Activity:  increased and restlesseness  Concentration:  Concentration: Good and Attention Span: Good  Recall:  Good  Fund of Knowledge:  Good  Language:  Good  Akathisia:  Negative  Handed:  Right  AIMS (if indicated):     Assets:  Desire for Improvement Resilience  ADL's:  Intact  Cognition:  WNL  Sleep:  Number of Hours: 4.75     Treatment Plan Summary: Daily contact with patient to assess and evaluate symptoms and progress in treatment.  - Continue inpatient hospitalization.  - Will continue today 02/18/2018 plan as below except where it is noted.  Substance withdrawal symptoms.   - Continue the Librium detox protocols.  Agitation.   - Continue rEMERON 7.5MG PO QHS  Depression.    - Resumed REMERON 7.5 mg po daily.  Insomnia.    - Continue Trazodone 100 mg po Q hs prn, may repeat x 1.  Other medical issues.   - Continue ASA 325 mg po daily for heart health.   - Continue NTG 0.4 mg po Q 5 minutes prn for chest pain.   - Continue Lipitor 40 mg po daily for high cholesterol.  - Patient to continue to attend & participate in the group counseling sessions.  -  Discharge disposition ongoing.  Nanci Pina, FNP,BC. 02/18/2018, 12:28 PM   ..Agree with NP Progress Note

## 2018-02-18 NOTE — Progress Notes (Signed)
[facility] MD Progress Note  02/18/2018 12:13 PM Derek Little  MRN:  [id number]  Subjective: I DON'T HAVE ANYTHING. I LOST EVERYTHING. I been dealing with a lot of abandonment stuff. I had a dream that I was dying, and I was happy that I was dying. I kept having a heart attack in my dream. I woke up and the thoughts were still there. I thought about dying several times.   32 year old male, presented to ED voluntarily requesting treatment for substance use disorder. Recently discharged from our unit on August 24 at the 3 length of stay, patient returns with worsening depression and suicidal ideations after relapsed on opiates and alcohol.  He reports that the relationship between his girlfriend and child has deteriorated and now he feels helpless and hopeless.  Derek Little is seen, chart reviewed. The chart findings discussed with the treatment team. He visible on the unit.  During the evaluation patient presents very disengaged, sad, hopeless, and very tearful unable to complete evaluation in its entirety. He presents with a significant amount of anxiety and psychomotor agitation and restlessness. He continues to endorse a significant amount of depression rating it at 10 out of 10 with 10 being the worst.  During his previous admission upon chart review patient also presented in such manner, however they requested discharge within a couple days. He endorses a significant amount of repetitive and intrusive thoughts about his heart attack and worsening anxiety. This time patient reports he is willing to complete treatment and participating in therapeutic milieu as suggested.  He is open to the detox program after discharge. He denies any SIHI, AVH, delusional thoughts or paranoia. Resumed Derek Little's  Seroquel 12m po qhs, Remeron 7.592mpo qhs, and hydroxyzine.  He has agreed to continue current plan of care already in progress. He denies any SIHI, AVH, delusional thoughts or paranoia.  Principal Problem: Severe  recurrent major depression without psychotic features ([facility] Diagnosis:   Patient Active Problem List   Diagnosis Date Noted  . Severe recurrent major depression without psychotic features (HCLuyando[F33.2] 02/16/2018  . Polysubstance (including opioids) dependence, daily use (HCDurant[F19.20] 02/01/2018  . Opioid dependence with opioid-induced mood disorder (HCCavalero[F11.24]   . Alcohol dependence with alcohol-induced mood disorder (HCMaud[F10.24]   . MDD (major depressive disorder), recurrent severe, without psychosis (HCDenison[F33.2] 01/31/2018   Total Time spent with patient: 25 minutes  Past Psychiatric History: See H&P  Past Medical History:  Past Medical History:  Diagnosis Date  . CAD (coronary artery disease)     Past Surgical History:  Procedure Laterality Date  . CARDIAC SURGERY     Family History: History reviewed. No pertinent family history.  Family Psychiatric  History: See H&P  Social History:  Social History   Substance and Sexual Activity  Alcohol Use Yes     Social History   Substance and Sexual Activity  Drug Use Yes  . Types: Marijuana   Comment: Heroin /Percocet    Social History   Socioeconomic History  . Marital status: Single    Spouse name: Not on file  . Number of children: 2  . Years of education: high school  . Highest education level: Not on file  Occupational History  . Not on file  Social Needs  . Financial resource strain: Somewhat hard  . Food insecurity:    Worry: Sometimes true    Inability: Never true  . Transportation needs:    Medical: Yes    Non-medical: Yes  Tobacco Use  . Smoking status: Current Every Day Smoker  . Smokeless tobacco: Former Systems developer    Types: Snuff  Substance and Sexual Activity  . Alcohol use: Yes  . Drug use: Yes    Types: Marijuana    Comment: Heroin /Percocet  . Sexual activity: Yes    Birth control/protection: Condom  Lifestyle  . Physical activity:    Days per week: 0 days    Minutes per session: 0  min  . Stress: Rather much  Relationships  . Social connections:    Talks on phone: Never    Gets together: Never    Attends religious service: Never    Active member of club or organization: No    Attends meetings of clubs or organizations: Not on file    Relationship status: Living with partner  Other Topics Concern  . Not on file  Social History Narrative  . Not on file   Additional Social History:   Sleep: Fair  Appetite:  Good  Current Medications: Current Facility-Administered Medications  Medication Dose Route Frequency Provider Last Rate Last Dose  . acetaminophen (TYLENOL) tablet 650 mg  650 mg Oral Q6H PRN Lindon Romp A, NP   650 mg at 02/17/18 2340  . alum & mag hydroxide-simeth (MAALOX/MYLANTA) 200-200-20 MG/5ML suspension 30 mL  30 mL Oral Q4H PRN Lindon Romp A, NP      . aspirin EC tablet 81 mg  81 mg Oral Daily Cobos, Myer Peer, MD   81 mg at 02/18/18 0901  . atorvastatin (LIPITOR) tablet 10 mg  10 mg Oral q1800 Cobos, Myer Peer, MD   10 mg at 02/17/18 1735  . chlordiazePOXIDE (LIBRIUM) capsule 25 mg  25 mg Oral Q6H PRN Cobos, Myer Peer, MD   25 mg at 02/17/18 2340  . gabapentin (NEURONTIN) capsule 100 mg  100 mg Oral TID Cobos, Myer Peer, MD   100 mg at 02/18/18 1211  . hydrOXYzine (ATARAX/VISTARIL) tablet 25 mg  25 mg Oral Q6H PRN Cobos, Myer Peer, MD   25 mg at 02/18/18 0904  . hydrOXYzine (ATARAX/VISTARIL) tablet 50 mg  50 mg Oral TID PRN Nanci Pina, FNP      . loperamide (IMODIUM) capsule 2-4 mg  2-4 mg Oral PRN Cobos, Myer Peer, MD      . magnesium hydroxide (MILK OF MAGNESIA) suspension 30 mL  30 mL Oral Daily PRN Lindon Romp A, NP      . metoprolol tartrate (LOPRESSOR) tablet 37.5 mg  37.5 mg Oral BID Lindon Romp A, NP   37.5 mg at 02/18/18 0901  . mirtazapine (REMERON) tablet 7.5 mg  7.5 mg Oral QHS Cobos, Myer Peer, MD   7.5 mg at 02/17/18 2122  . multivitamin with minerals tablet 1 tablet  1 tablet Oral Daily Cobos, Myer Peer, MD   1  tablet at 02/18/18 0901  . nicotine (NICODERM CQ - dosed in mg/24 hours) patch 21 mg  21 mg Transdermal Daily Lindon Romp A, NP   Stopped at 02/17/18 0915  . nitroGLYCERIN (NITROSTAT) SL tablet 0.4 mg  0.4 mg Sublingual Q5 min PRN Lindon Romp A, NP   0.4 mg at 02/17/18 2351  . thiamine (B-1) injection 100 mg  100 mg Intramuscular Once Cobos, Fernando A, MD      . thiamine (VITAMIN B-1) tablet 100 mg  100 mg Oral Daily Cobos, Myer Peer, MD   100 mg at 02/18/18 0902  . traZODone (DESYREL) tablet 100 mg  100  mg Oral QHS,MR X 1 Starkes, Devora Tortorella S, FNP       Lab Results:  Results for orders placed or performed during the hospital encounter of 02/16/18 (from the past 48 hour(s))  Basic metabolic panel     Status: Abnormal   Collection Time: 02/18/18  1:00 AM  Result Value Ref Range   Sodium 140 135 - 145 mmol/L   Potassium 3.5 3.5 - 5.1 mmol/L   Chloride 105 98 - 111 mmol/L   CO2 26 22 - 32 mmol/L   Glucose, Bld 106 (H) 70 - 99 mg/dL   BUN 12 6 - 20 mg/dL   Creatinine, Ser 0.89 0.61 - 1.24 mg/dL   Calcium 9.0 8.9 - 10.3 mg/dL   GFR calc non Af Amer >60 >60 mL/min   GFR calc Af Amer >60 >60 mL/min    Comment: (NOTE) The eGFR has been calculated using the CKD EPI equation. This calculation has not been validated in all clinical situations. eGFR's persistently <60 mL/min signify possible Chronic Kidney Disease.    Anion gap 9 5 - 15    Comment: Performed at [facility] [address] [place] [address]  CBC     Status: None   Collection Time: 02/18/18  1:00 AM  Result Value Ref Range   WBC 8.9 4.0 - 10.5 K/uL   RBC 4.60 4.22 - 5.81 MIL/uL   Hemoglobin 13.6 13.0 - 17.0 g/dL   HCT 40.4 39.0 - 52.0 %   MCV 87.8 78.0 - 100.0 fL   MCH 29.6 26.0 - 34.0 pg   MCHC 33.7 30.0 - 36.0 g/dL   RDW 12.2 11.5 - 15.5 %   Platelets 215 150 - 400 K/uL    Comment: Performed at [facility], [place] [address] [place], [place] [address]  I-stat troponin, ED     Status: None    Collection Time: 02/18/18  1:23 AM  Result Value Ref Range   Troponin i, poc 0.03 0.00 - 0.08 ng/mL   Comment 3            Comment: Due to the release kinetics of cTnI, a negative result within the first hours of the onset of symptoms does not rule out myocardial infarction with certainty. If myocardial infarction is still suspected, repeat the test at appropriate intervals.   I-Stat Troponin, ED (not at [facility])     Status: None   Collection Time: 02/18/18  4:31 AM  Result Value Ref Range   Troponin i, poc 0.02 0.00 - 0.08 ng/mL   Comment 3            Comment: Due to the release kinetics of cTnI, a negative result within the first hours of the onset of symptoms does not rule out myocardial infarction with certainty. If myocardial infarction is still suspected, repeat the test at appropriate intervals.     Blood Alcohol level:  Lab Results  Component Value Date   ETH <10 02/16/2018   ETH <10 83/29/1916   Metabolic Disorder Labs: No results found for: HGBA1C, MPG No results found for: PROLACTIN No results found for: CHOL, TRIG, HDL, CHOLHDL, VLDL, LDLCALC  Physical Findings: AIMS: Facial and Oral Movements Muscles of Facial Expression: None, normal Lips and Perioral Area: None, normal Jaw: None, normal Tongue: None, normal,Extremity Movements Upper (arms, wrists, hands, fingers): None, normal Lower (legs, knees, ankles, toes): None, normal, Trunk Movements Neck, shoulders, hips: None, normal, Overall Severity Severity of abnormal movements (highest  score from questions above): None, normal Incapacitation due to abnormal movements: None, normal Patient's awareness of abnormal movements (rate only patient's report): No Awareness, Dental Status Current problems with teeth and/or dentures?: No Does patient usually wear dentures?: No  CIWA:  CIWA-Ar Total: 2 COWS:  COWS Total Score: 8  Musculoskeletal: Strength & Muscle Tone: within normal limits Gait & Station:  normal Patient leans: N/A  Psychiatric Specialty Exam: Physical Exam  Nursing note and vitals reviewed.   Review of Systems  Psychiatric/Behavioral: Positive for depression and substance abuse (Opioid use disorder). Negative for hallucinations, memory loss and suicidal ideas. The patient is nervous/anxious and has insomnia.     Blood pressure 119/87, pulse 74, temperature 98.7 F (37.1 C), temperature source Oral, resp. rate 16, height '5\' 8"'  (1.727 m), weight 70.8 kg, SpO2 99 %.Body mass index is 23.72 kg/m.  General Appearance: Fairly Groomed  Eye Contact:  Good  Speech:  Normal Rate  Volume:  Normal  Mood:  depressed, anxious   Affect:  reactive,tearful at times, anxious  Thought Process:  Linear and Descriptions of Associations: Intact  Orientation:  Full (Time, Place, and Person)  Thought Content:  no hallucinations, no delusions, not internally preoccupied   Suicidal Thoughts:  No denies suicidal or self injurious ideations, denies homicidal or violent ideations  Homicidal Thoughts:  No  Memory:  recent and remote grossly intact   Judgement:  Fair  Insight:  Fair  Psychomotor Activity:  increased and restlesseness  Concentration:  Concentration: Good and Attention Span: Good  Recall:  Good  Fund of Knowledge:  Good  Language:  Good  Akathisia:  Negative  Handed:  Right  AIMS (if indicated):     Assets:  Desire for Improvement Resilience  ADL's:  Intact  Cognition:  WNL  Sleep:  Number of Hours: 4.75     Treatment Plan Summary: Daily contact with patient to assess and evaluate symptoms and progress in treatment.  - Continue inpatient hospitalization.  - Will continue today 02/18/2018 plan as below except where it is noted.  Substance withdrawal symptoms.   - Continue the Librium detox protocols.  Agitation.   - Continue rEMERON 7.5MG PO QHS  Depression.    - Resumed REMERON 7.5 mg po daily.  Insomnia.    - Continue Trazodone 100 mg po Q hs prn, may repeat  x 1.  Other medical issues.   - Continue ASA 325 mg po daily for heart health.   - Continue NTG 0.4 mg po Q 5 minutes prn for chest pain.   - Continue Lipitor 40 mg po daily for high cholesterol.  - Patient to continue to attend & participate in the group counseling sessions.  - Discharge disposition ongoing.  Nanci Pina, FNP,BC. 02/18/2018, 12:13 PM

## 2018-02-18 NOTE — Progress Notes (Signed)
Patient ID: Derek Little, male   DOB: February 11, 1986, 32 y.o.   MRN: [id number] D: Pt reports a poor sleep quality last night, states that he "didn't sleep at all", and was encouraged to talk to provider about his concerns.  Trazodone 100mg  has been ordered for sleep tonight.  Pt reports a good appetite, reports a normal energy level, reports a god concentration level, and rates his depression today as 4 (10 being the worst), rates his hopelessness level as 2 (10 being the worst), and rates his anxiety today as 5 (10 being the worst).  Pt rates his back pain as 5/10, was offered Tylenol 650mg  for pain and declined. Pt denies SI/HI/AVH, and is observed restless in the hallway at times, and explains that he is just worried that he will have no where to live once he get discharged from the unit.Pt reports that his goal is to follow up outpatient with [facility]".  Pt has been encouraged to talk to SW regarding getting into this program, and has been encouraged to call the resources which he was given for housing.  A: Pt being given all of his medications as scheduled, and is being maintained on Q15 minute safety checks.    R: Pt will continue to be monitored on Q15 minute checks for safety.

## 2018-02-18 NOTE — Progress Notes (Signed)
Pt presents with labile affect and mood.  He has been attempting to staff split throughout the shift.  He is demanding, argumentative, oppositional, and disruptive to the milieu.  Pt frequently stands at the nurse's station making demands of staff, such as having his laundry done immediately, getting coffee made, requesting to switch rooms, or having his bed adjusted immediately, getting Gatorade.  He has referred to staff as "bitch" and makes negative comments to staff when he does not get what he requests immediately.  If he does not get the answer he wants from staff, he will go to another staff member with his requests, including the on-site provider, Diplomatic Services operational officer, other nurses on the unit.  He encouraged peer to knock on on-site provider's office door with any complaints and needs she has.  He remains somatically preoccupied, reporting he thinks there are bed bugs in his room.  Pt was demanding staff to check to see if his laundry is done and when he was told his laundry will be brought to him when it is finished, he became agitated to the point of yelling at staff members.  He reports difficulty sleeping and repeatedly requests to pace the hallway.  Pt does not appear to be trying to sleep, as he has been repeatedly coming to nurse's station or standing in his doorway.  Pt was encouraged to stay in his room and was offered the quiet room.  He refused to go to the quiet room and walked away mumbling profanities.

## 2018-02-18 NOTE — ED Notes (Signed)
[facility] called for transport to [facility]. 

## 2018-02-18 NOTE — ED Provider Notes (Signed)
[facility] EMERGENCY DEPARTMENT Provider Note   CSN: [id number] Arrival date & time: 02/18/18  0043     History   Chief Complaint Chief Complaint  Patient presents with  . Chest Pain  . Anxiety    HPI Derek Little is a 32 y.o. male.  HPI  This is a 32 year old male with a history of coronary artery disease status post CABG x2, polysubstance abuse who presents from behavioral health with chest pain.  Patient had a CABG in April after having an NSTEMI.  This was performed at [facility] in [place].  Patient is currently behavioral health for detox and suicidal ideation.  Reports onset of chest discomfort 1 hour prior to arrival.  He states that it was squeezing in nature and radiated into his left arm.  He took nitroglycerin with relief.  He is currently pain-free.  No associated nausea or shortness of breath.  He reports similar symptoms prior to having his CABG.  Reports compliance with medications.  Past Medical History:  Diagnosis Date  . CAD (coronary artery disease)     Patient Active Problem List   Diagnosis Date Noted  . Severe recurrent major depression without psychotic features (HCC) 02/16/2018  . Polysubstance (including opioids) dependence, daily use (HCC) 02/01/2018  . Opioid dependence with opioid-induced mood disorder (HCC)   . Alcohol dependence with alcohol-induced mood disorder (HCC)   . MDD (major depressive disorder), recurrent severe, without psychosis (HCC) 01/31/2018    Past Surgical History:  Procedure Laterality Date  . CARDIAC SURGERY          Home Medications    Prior to Admission medications   Medication Sig Start Date End Date Taking? Authorizing Provider  aspirin 81 MG chewable tablet Chew 81 mg by mouth daily.   Yes [provider]  gabapentin (NEURONTIN) 300 MG capsule Take 1 capsule (300 mg total) by mouth 3 (three) times daily. 02/03/18  Yes Oneta Rack, NP  metoprolol tartrate (LOPRESSOR) 25 MG  tablet Take 37.5 mg by mouth 2 (two) times daily.   Yes [provider]  nicotine (NICODERM CQ - DOSED IN MG/24 HOURS) 21 mg/24hr patch Place 1 patch (21 mg total) onto the skin daily. 02/03/18  Yes Oneta Rack, NP  QUEtiapine (SEROQUEL) 25 MG tablet Take 1 tablet (25 mg total) by mouth at bedtime. 02/03/18  Yes Oneta Rack, NP  sertraline (ZOLOFT) 25 MG tablet Take 25 mg by mouth daily. 11/02/17  Yes [provider]  nitroGLYCERIN (NITROSTAT) 0.4 MG SL tablet Place 0.4 mg under the tongue every 5 (five) minutes as needed for chest pain.    [provider]  traZODone (DESYREL) 100 MG tablet Take 1 tablet (100 mg total) by mouth at bedtime and may repeat dose one time if needed. 02/03/18   Oneta Rack, NP    Family History History reviewed. No pertinent family history.  Social History Social History   Tobacco Use  . Smoking status: Current Every Day Smoker  . Smokeless tobacco: Former Neurosurgeon    Types: Snuff  Substance Use Topics  . Alcohol use: Yes  . Drug use: Yes    Types: Marijuana    Comment: Heroin /Percocet     Allergies   Patient has no known allergies.   Review of Systems Review of Systems  Constitutional: Negative for fever.  Respiratory: Negative for shortness of breath.   Cardiovascular: Positive for chest pain. Negative for palpitations and leg swelling.  Gastrointestinal:  Negative for abdominal pain, nausea and vomiting.  Genitourinary: Negative for dysuria.  All other systems reviewed and are negative.    Physical Exam Updated Vital Signs BP 101/70   Pulse 68   Ht 1.727 m (5\' 8" )   Wt 70.8 kg   SpO2 100%   BMI 23.72 kg/m   Physical Exam  Constitutional: He is oriented to person, place, and time. He appears well-developed and well-nourished. No distress.  HENT:  Head: Normocephalic and atraumatic.  Eyes: Pupils are equal, round, and reactive to light.  Neck: Neck supple.  Cardiovascular: Normal rate, regular rhythm,  normal heart sounds and normal pulses.  No murmur heard. Healing midline sternotomy scar  Pulmonary/Chest: Effort normal and breath sounds normal. No respiratory distress. He has no wheezes.  Abdominal: Soft. Bowel sounds are normal. There is no tenderness. There is no rebound.  Musculoskeletal: He exhibits no edema.       Right lower leg: He exhibits no edema.       Left lower leg: He exhibits no edema.  Lymphadenopathy:    He has no cervical adenopathy.  Neurological: He is alert and oriented to person, place, and time.  Skin: Skin is warm and dry.  Psychiatric: He has a normal mood and affect.  Nursing note and vitals reviewed.    ED Treatments / Results  Labs (all labs ordered are listed, but only abnormal results are displayed) Labs Reviewed  BASIC METABOLIC PANEL - Abnormal; Notable for the following components:      Result Value   Glucose, Bld 106 (*)    All other components within normal limits  CBC  I-STAT TROPONIN, ED  I-STAT TROPONIN, ED    EKG EKG Interpretation  Date/Time:  Sunday February 18 2018 00:56:39 EDT Ventricular Rate:  70 PR Interval:    QRS Duration: 89 QT Interval:  400 QTC Calculation: 432 R Axis:   55 Text Interpretation:  Sinus rhythm Confirmed by Ross Marcus ([id number]) on 02/18/2018 5:07:39 AM   Radiology Dg Chest 2 View  Result Date: 02/18/2018 CLINICAL DATA:  Chest pain. Mid chest tightness. Pain resolved with nitroglycerin. History of myocardial infarct in April of 2019. Shortness of breath for a month. EXAM: CHEST - 2 VIEW COMPARISON:  08/31/2009 FINDINGS: Postoperative changes in the mediastinum. Normal heart size and pulmonary vascularity. No focal airspace disease or consolidation in the lungs. No blunting of costophrenic angles. No pneumothorax. Mediastinal contours appear intact. IMPRESSION: No active cardiopulmonary disease. Electronically Signed   By: Burman Nieves M.D.   On: 02/18/2018 01:53    Procedures Procedures  (including critical care time)  Medications Ordered in ED Medications  metoprolol tartrate (LOPRESSOR) tablet 37.5 mg (37.5 mg Oral Given 02/17/18 1735)  nicotine (NICODERM CQ - dosed in mg/24 hours) patch 21 mg (0 mg Transdermal Hold 02/17/18 0915)  nitroGLYCERIN (NITROSTAT) SL tablet 0.4 mg (0.4 mg Sublingual Given 02/17/18 2351)  acetaminophen (TYLENOL) tablet 650 mg (650 mg Oral Given 02/17/18 2340)  alum & mag hydroxide-simeth (MAALOX/MYLANTA) 200-200-20 MG/5ML suspension 30 mL (has no administration in time range)  magnesium hydroxide (MILK OF MAGNESIA) suspension 30 mL (has no administration in time range)  thiamine (B-1) injection 100 mg (100 mg Intramuscular Not Given 02/16/18 1842)  thiamine (VITAMIN B-1) tablet 100 mg (100 mg Oral Given 02/17/18 0910)  multivitamin with minerals tablet 1 tablet (1 tablet Oral Given 02/17/18 0910)  chlordiazePOXIDE (LIBRIUM) capsule 25 mg (25 mg Oral Given 02/17/18 2340)  hydrOXYzine (ATARAX/VISTARIL) tablet 25 mg (25  mg Oral Given 02/17/18 2337)  loperamide (IMODIUM) capsule 2-4 mg (has no administration in time range)  atorvastatin (LIPITOR) tablet 10 mg (10 mg Oral Given 02/17/18 1735)  aspirin EC tablet 81 mg (81 mg Oral Given 02/17/18 0910)  mirtazapine (REMERON) tablet 7.5 mg (7.5 mg Oral Given 02/17/18 2122)  gabapentin (NEURONTIN) capsule 100 mg (100 mg Oral Given 02/17/18 1735)  QUEtiapine (SEROQUEL) tablet 50 mg (50 mg Oral Not Given 02/18/18 0025)  QUEtiapine (SEROQUEL) 50 MG tablet (  Duplicate 02/18/18 0025)     Initial Impression / Assessment and Plan / ED Course  I have reviewed the triage vital signs and the nursing notes.  Pertinent labs & imaging results that were available during my care of the patient were reviewed by me and considered in my medical decision making (see chart for details).     Patient presents with chest pain.  He is currently at behavioral health Hospital.  Recent NSTEMI with CABG x2.  He is overall nontoxic.  He is currently  pain-free.  EKG shows no evidence of acute ischemia.  Chest x-ray without evidence of pneumothorax or pneumonia.  Is obviously high risk; however, given CABG, would have less risk of recurrent stenosis/occlusion then with traditional stenting.  Given that he remains pain-free with a completely normal EKG, will repeat his troponin in 3 hours.  Repeat troponin remains stable and patient has remained chest pain-free.  At this time feel he is safe for discharge back to behavioral health hospital.  If he has recurrence of symptoms he should be reevaluated.  Of note, heart rate is 68.  Low suspicion for PE.  After history, exam, and medical workup I feel the patient has been appropriately medically screened and is safe for discharge home. Pertinent diagnoses were discussed with the patient. Patient was given return precautions.   Final Clinical Impressions(s) / ED Diagnoses   Final diagnoses:  Precordial pain    ED Discharge Orders    None       Horton, Mayer Masker, MD 02/18/18 [phone]

## 2018-02-18 NOTE — [facility] Suicide Risk Assessment (Signed)
[facility] INPATIENT:  Family/Significant Other Suicide Prevention Education  Suicide Prevention Education:  Education Completed; Mother Hurs[place] Party [phone],  (name of family member/significant other) has been identified by the patient as the family member/significant other with whom the patient will be residing, and identified as the person(s) who will aid the patient in the event of a mental health crisis (suicidal ideations/suicide attempt).  With written consent from the patient, the family member/significant other has been provided the following suicide prevention education, prior to the and/or following the discharge of the patient.  The suicide prevention education provided includes the following:  Suicide risk factors  Suicide prevention and interventions  National Suicide Hotline telephone number  [facility] assessment telephone number  [facility] Emergency Assistance 911  [facility] and/or Residential Mobile Crisis Unit telephone number  Request made of family/significant other to:  Remove weapons (e.g., guns, rifles, knives), all items previously/currently identified as safety concern.    Remove drugs/medications (over-the-counter, prescriptions, illicit drugs), all items previously/currently identified as a safety concern.  The family member/significant other verbalizes understanding of the suicide prevention education information provided.  The family member/significant other agrees to remove the items of safety concern listed above.  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 Mother shared he is bugging out about not speaking to his children and that she has tried to speak to the kid's mother but she is not okay with it and reports he yells at her through the child. Pt's mother reports she encouraged him to write his kids letters but he doesn't listen to me. Mom  shared he tries to make me feel guilty saying everyone has family here but me. Mom reports she is in [place] and did not make him move there. Mom reports she is trying to smooth things out so he can return eventually but that there are some issues still here with him not apologizing to my husband and I have kids here too to look out for. Mom reports he may be able to one day but its not in the near future or tomorrow like he wants. Mom reports in the meantime he needs to go straight from here to a 30 day program of some sort. Mom shared she is scared for him and cries all the time. Mom reports he has no weapons to her knowledge and all he has is with him at the hospital in his backpack. Mom reports there is no money for treatment so they will need to take his insurance. Mom wrote down the crisis number.   Shellia Cleverly 02/18/2018, 9:51 AM

## 2018-02-19 LAB — I-STAT TROPONIN, ED
TROPONIN I, POC: 0.01 ng/mL (ref 0.00–0.08)
Troponin i, poc: 0.02 ng/mL (ref 0.00–0.08)

## 2018-02-19 MED ORDER — MIRTAZAPINE 7.5 MG PO TABS: 7.5000 mg | ORAL_TABLET | Freq: Every day | ORAL | 0 refills | Status: DC

## 2018-02-19 MED ORDER — TRAZODONE HCL 100 MG PO TABS: 100.0000 mg | ORAL_TABLET | Freq: Every evening | ORAL | 0 refills | Status: DC | PRN

## 2018-02-19 MED ORDER — HYDROXYZINE HCL 50 MG PO TABS: 50.0000 mg | ORAL_TABLET | Freq: Three times a day (TID) | ORAL | 0 refills | Status: DC | PRN

## 2018-02-19 MED ORDER — METOPROLOL TARTRATE 37.5 MG PO TABS: 37.5000 mg | ORAL_TABLET | Freq: Two times a day (BID) | ORAL | 0 refills | Status: DC

## 2018-02-19 MED ORDER — TRAZODONE HCL 100 MG PO TABS: 100.0000 mg | ORAL_TABLET | Freq: Every evening | ORAL | Status: DC | PRN

## 2018-02-19 MED ORDER — GABAPENTIN 100 MG PO CAPS: 100.0000 mg | ORAL_CAPSULE | Freq: Three times a day (TID) | ORAL | 0 refills | Status: DC

## 2018-02-19 MED ORDER — NITROGLYCERIN 0.4 MG SL SUBL: 0.4000 mg | SUBLINGUAL_TABLET | SUBLINGUAL | 1 refills | Status: DC | PRN

## 2018-02-19 MED ORDER — ATORVASTATIN CALCIUM 10 MG PO TABS: 10.0000 mg | ORAL_TABLET | Freq: Every day | ORAL | 0 refills | Status: DC

## 2018-02-19 NOTE — [facility] Suicide Risk Assessment (Addendum)
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 Severe recurrent major depression without psychotic features (HCC) [F33.2] 02/16/2018  . Polysubstance (including opioids) dependence, daily use (HCC) [F19.20] 02/01/2018  . Opioid dependence with opioid-induced mood disorder (HCC) [F11.24]   . Alcohol dependence with alcohol-induced mood disorder (HCC) [F10.24]   . MDD (major depressive disorder), recurrent severe, without psychosis (HCC) [F33.2] 01/31/2018    Total Time spent with patient: 30 minutes  Musculoskeletal: Strength & Muscle Tone: within normal limits Gait & Station: normal Patient leans: N/A  Psychiatric Specialty Exam: ROS no headache, denies chest pain, no shortness of breath, no vomiting, no fever, no chills , no diarrhea  Blood pressure (!) 116/98, pulse 76, temperature 97.9 F (36.6 C), temperature source Oral, resp. rate 20, height 5\' 8"  (1.727 m), weight 79.4 kg, SpO2 100 %.Body mass index is 26.61 kg/m.  General Appearance: Well Groomed  Eye Contact::  Good  Speech:  Normal Rate409  Volume:  Normal  Mood:  reports " my mood is OK right now", denies feeling depressed at present   Affect:  at this time appropriate, reactive, pleasant   Thought Process:  Linear and Descriptions of Associations: Intact  Orientation:  Full (Time, Place, and Person)  Thought Content:  no hallucinations, no delusions, not internally preoccupied   Suicidal Thoughts:  No denies any suicidal or self injurious ideations, denies any homicidal or violent ideations   Homicidal Thoughts:  No denies homicidal or violent ideations   Memory:  recent and remote grossly intact   Judgement:  Other:  fair- improving   Insight:  fair- improving  Psychomotor Activity:  Normal- no current psychomotor restlessness or agitation  Concentration:  Good   Recall:  Good  Fund of Knowledge:Good  Language: Good  Akathisia:  Negative  Handed:  Right  AIMS (if indicated):     Assets:  Communication Skills Desire for Improvement Resilience  Sleep:  Number of Hours: 6.75  Cognition: WNL  ADL's:  Intact   Mental Status Per Nursing Assessment::   On Admission:  Suicidal ideation indicated by patient  Demographic Factors:  32, single, two children, currently with their mothers, currently homeless , denies legal issues   Loss Factors: Homelessness, recent relapse , recent relationship stressors/break up  Historical Factors: History of opiate, alcohol use disorder, history of depression, history of MI earlier this year   Risk Reduction Factors:   Sense of responsibility to family and Positive coping skills or problem solving skills  Continued Clinical Symptoms:  At this time patient is alert, attentive, calm . Reports mood is improved , minimizes depression at this time, and affect presents full in range. No current restlessness or psychomotor agitation and no current symptoms of opiate/alcohol withdrawal noted or reported . At this time no thought disorder, no suicidal or self injurious ideations, no homicidal or violent ideations, specifically also denies any homicidal or violent ideations towards GF, no psychotic symptoms, future oriented, wanting to enter an [employer] note, patient was noted to rip a bed sheet yesterday. Patient states this was accidental and not a suicide gesture. Patient states " I was feeling cold, I had a bunch of sheets, and I pulled that one up and it got stuck and ripped". States he did not have any suicidal thoughts and states that " I only get suicidal thoughts when  I am using drugs, my mood is a lot better " No current chest pain, no dyspnea. ( yesterday evening had complained of chest pain and was evaluated,cleared in ED- troponin negative )  Denies medication side effects.  Cognitive Features That  Contribute To Risk:  No gross cognitive deficits noted upon discharge. Is alert , attentive, and oriented x 3   Suicide Risk:  Mild:  Suicidal ideation of limited frequency, intensity, duration, and specificity.  There are no identifiable plans, no associated intent, mild dysphoria and related symptoms, good self-control (both objective and subjective assessment), few other risk factors, and identifiable protective factors, including available and accessible social support.  Follow-up Information    Your cardiologist.           Plan Of Care/Follow-up recommendations:  Activity:  as tolerated Diet:  heart healthy Tests:  NA Other:  See below  Patient is expressing readiness for discharge, leaving unit in good spirits - currently no grounds for involuntary commitment . Plans to go to an [address] [employer]) later today.  Plans to follow up at [facility] for outpatient treatment Encouraged to attend NA or AA meetings regularly Plans to follow up with his Cardiologist , Dr. Pasi, for ongoing management of history of cardiac illness .  Craige Cotta, MD 02/19/2018, 1:07 PM

## 2018-02-19 NOTE — ED Provider Notes (Signed)
[facility] EMERGENCY DEPARTMENT Provider Note  CSN: [id number] Arrival date & time: 02/18/18 0043  Chief Complaint(s) Chest Pain and Anxiety  HPI Derek Little is a 32 y.o. male with a history of CAD status post CABG x2, polysubstance abuse who is admitted to behavioral health and returns to the emergency department for chest pain described as substernal pressure that is nonradiating and nonexertional.  No associated nausea or emesis, shortness of breath, diaphoresis.  Pain resolved after a single nitroglycerin.  Patient is currently chest pain-free.  No recent fevers or infections.  This was in the setting of anxiety.   Seen here last night for the same.  HPI  Past Medical History Past Medical History:  Diagnosis Date  . CAD (coronary artery disease)    Patient Active Problem List   Diagnosis Date Noted  . Severe recurrent major depression without psychotic features (HCC) 02/16/2018  . Polysubstance (including opioids) dependence, daily use (HCC) 02/01/2018  . Opioid dependence with opioid-induced mood disorder (HCC)   . Alcohol dependence with alcohol-induced mood disorder (HCC)   . MDD (major depressive disorder), recurrent severe, without psychosis (HCC) 01/31/2018   Home Medication(s) Prior to Admission medications   Medication Sig Start Date End Date Taking? Authorizing Provider  aspirin 81 MG chewable tablet Chew 81 mg by mouth daily.   Yes [provider]  gabapentin (NEURONTIN) 300 MG capsule Take 1 capsule (300 mg total) by mouth 3 (three) times daily. 02/03/18  Yes Oneta Rack, NP  metoprolol tartrate (LOPRESSOR) 25 MG tablet Take 37.5 mg by mouth 2 (two) times daily.   Yes [provider]  nicotine (NICODERM CQ - DOSED IN MG/24 HOURS) 21 mg/24hr patch Place 1 patch (21 mg total) onto the skin daily. 02/03/18  Yes Oneta Rack, NP  QUEtiapine (SEROQUEL) 25 MG tablet Take 1 tablet (25 mg total) by mouth at bedtime. 02/03/18  Yes  Oneta Rack, NP  sertraline (ZOLOFT) 25 MG tablet Take 25 mg by mouth daily. 11/02/17  Yes [provider]  nitroGLYCERIN (NITROSTAT) 0.4 MG SL tablet Place 0.4 mg under the tongue every 5 (five) minutes as needed for chest pain.    [provider]  traZODone (DESYREL) 100 MG tablet Take 1 tablet (100 mg total) by mouth at bedtime and may repeat dose one time if needed. 02/03/18   Oneta Rack, NP                                                                                                                                    Past Surgical History Past Surgical History:  Procedure Laterality Date  . CARDIAC SURGERY     Family History History reviewed. No pertinent family history.  Social History Social History   Tobacco Use  . Smoking status: Current Every Day Smoker  . Smokeless tobacco: Former Neurosurgeon    Types: Snuff  Substance Use Topics  .  Alcohol use: Yes  . Drug use: Yes    Types: Marijuana    Comment: Heroin /Percocet   Allergies Patient has no known allergies.  Review of Systems Review of Systems All other systems are reviewed and are negative for acute change except as noted in the HPI  Physical Exam Vital Signs  I have reviewed the triage vital signs BP 118/74 (BP Location: Left Arm)   Pulse 74   Temp 97.9 F (36.6 C) (Oral)   Resp (!) 25   Ht 5\' 8"  (1.727 m)   Wt 79.4 kg   SpO2 98%   BMI 26.61 kg/m   Physical Exam  Constitutional: He is oriented to person, place, and time. He appears well-developed and well-nourished. No distress.  HENT:  Head: Normocephalic and atraumatic.  Nose: Nose normal.  Eyes: Pupils are equal, round, and reactive to light. Conjunctivae and EOM are normal. Right eye exhibits no discharge. Left eye exhibits no discharge. No scleral icterus.  Neck: Normal range of motion. Neck supple.  Cardiovascular: Normal rate and regular rhythm. Exam reveals no gallop and no friction rub.  No murmur  heard. Pulmonary/Chest: Effort normal and breath sounds normal. No stridor. No respiratory distress. He has no rales.  Abdominal: Soft. He exhibits no distension. There is no tenderness.  Musculoskeletal: He exhibits no edema or tenderness.  Neurological: He is alert and oriented to person, place, and time.  Skin: Skin is warm and dry. No rash noted. He is not diaphoretic. No erythema.  Psychiatric: He has a normal mood and affect.  Vitals reviewed.   ED Results and Treatments Labs (all labs ordered are listed, but only abnormal results are displayed) Labs Reviewed  BASIC METABOLIC PANEL - Abnormal; Notable for the following components:      Result Value   Glucose, Bld 106 (*)    All other components within normal limits  CBC  I-STAT TROPONIN, ED  I-STAT TROPONIN, ED  I-STAT TROPONIN, ED  I-STAT TROPONIN, ED                                                                                                                         EKG  EKG Interpretation  Date/Time:  Monday February 19 2018 01:56:39 EDT Ventricular Rate:  85 PR Interval:  160 QRS Duration: 84 QT Interval:  392 QTC Calculation: 466 R Axis:   52 Text Interpretation:  Normal sinus rhythm Cannot rule out Inferior infarct , age undetermined Abnormal ECG No significant change since last tracing Confirmed by Drema Pry [phone]) on 02/19/2018 2:46:15 AM      Radiology No results found. Pertinent labs & imaging results that were available during my care of the patient were reviewed by me and considered in my medical decision making (see chart for details).  Medications Ordered in ED Medications  metoprolol tartrate (LOPRESSOR) tablet 37.5 mg (37.5 mg Oral Given 02/18/18 1600)  nicotine (NICODERM CQ - dosed in mg/24 hours) patch 21 mg (21 mg  Transdermal Not Given 02/18/18 0900)  nitroGLYCERIN (NITROSTAT) SL tablet 0.4 mg (0.4 mg Sublingual Given 02/17/18 2351)  acetaminophen (TYLENOL) tablet 650 mg (650 mg Oral Given  02/17/18 2340)  alum & mag hydroxide-simeth (MAALOX/MYLANTA) 200-200-20 MG/5ML suspension 30 mL (has no administration in time range)  magnesium hydroxide (MILK OF MAGNESIA) suspension 30 mL (has no administration in time range)  thiamine (B-1) injection 100 mg (100 mg Intramuscular Not Given 02/16/18 1842)  thiamine (VITAMIN B-1) tablet 100 mg (100 mg Oral Given 02/18/18 0902)  multivitamin with minerals tablet 1 tablet (1 tablet Oral Given 02/18/18 0901)  chlordiazePOXIDE (LIBRIUM) capsule 25 mg (25 mg Oral Given 02/17/18 2340)  loperamide (IMODIUM) capsule 2-4 mg (has no administration in time range)  atorvastatin (LIPITOR) tablet 10 mg (10 mg Oral Given 02/18/18 1602)  aspirin EC tablet 81 mg (81 mg Oral Given 02/18/18 0901)  mirtazapine (REMERON) tablet 7.5 mg (7.5 mg Oral Given 02/18/18 2143)  gabapentin (NEURONTIN) capsule 100 mg (100 mg Oral Given 02/18/18 1602)  traZODone (DESYREL) tablet 100 mg (100 mg Oral Given 02/18/18 2228)  hydrOXYzine (ATARAX/VISTARIL) tablet 50 mg (50 mg Oral Given 02/18/18 2208)  hydrocortisone cream 1 % ( Topical Given 02/18/18 1952)  QUEtiapine (SEROQUEL) 50 MG tablet (  Duplicate 02/18/18 0025)  traZODone (DESYREL) tablet 100 mg (0 mg Oral Duplicate 02/18/18 2237)  LORazepam (ATIVAN) tablet 2 mg (2 mg Oral Given 02/18/18 2233)                                                                                                                                    Procedures Procedures  (including critical care time)  Medical Decision Making / ED Course I have reviewed the nursing notes for this encounter and the patient's prior records (if available in EHR or on provided paperwork).    Chest pain in the setting of anxiety.  Patient is high risk given his history of CAD however, given CABG he would be at lower risk for ACS.  EKG did not reveal any acute ischemic changes or evidence of pericarditis.  Initial troponin negative.  Patient is chest pain-free.  Feel he should be appropriate for  delta troponin.  Low suspicion for pulmonary embolism.  Presentation not classic for aortic dissection or esophageal perforation.  Chest x-ray without evidence suggestive of pneumonia, pneumothorax, pneumomediastinum.  No abnormal contour of the mediastinum to suggest dissection. No evidence of acute injuries.  Delta trop negative.  Remains pain free.   The patient appears reasonably screened and/or stabilized for discharge and I doubt any other medical condition or other [facility] requiring further screening, evaluation, or treatment in the ED at this time prior to discharge.  The patient is safe for discharge with strict return precautions.   Final Clinical Impression(s) / ED Diagnoses Final diagnoses:  Precordial pain   Disposition: Discharge back to [facility]  Condition: Good  I have discussed the results, Dx and  Tx plan with the patient who expressed understanding and agree(s) with the plan. Discharge instructions discussed at great length. The patient was given strict return precautions who verbalized understanding of the instructions. No further questions at time of discharge.    ED Discharge Orders    None       Follow Up: Your cardiologist         This chart was dictated using voice recognition software.  Despite best efforts to proofread,  errors can occur which can change the documentation meaning.   Nira Conn, MD 02/19/18 [phone]

## 2018-02-19 NOTE — ED Notes (Signed)
[facility] arrived for patient transport back to [facility].

## 2018-02-19 NOTE — Progress Notes (Signed)
Pt was in the shower and when writer went to check on pt, there was a sheet over the top of the shower door and there was a pillow and a belongings bag at the bottom of the door, blocking visibility.  Writer took sheet down and saw that the sheet was ripped about 4 feet down the sheet.  Pt was asked to exit the bathroom.  He asked "what?  I can't keep that up to keep the heat in?"  Charge nurse, [facility], on-site provider notified of situation.  Pt was informed he will be placed on 1:1 as the ripped sheet is a suicide gesture.  He states "the sheet ripped when I pulled it off of the bed."  It does not appear the sheet would be able to rip 4 feet long from being pulled off of the bed.  Called for show of force.  Pt was hesitant to go to quiet room but gradually did so.  Pt is on 1:1 for safety.  1:1 staff is with pt at this time and pt is safe.  Will continue to monitor and assess.

## 2018-02-19 NOTE — Progress Notes (Signed)
Pt out of facility at this time.

## 2018-02-19 NOTE — ED Notes (Signed)
Patient was seen here about 24 hours ago for same and dx with anxiety. Patient is an inpatient BH and c/o CP.

## 2018-02-19 NOTE — Progress Notes (Addendum)
  [facility] Adult Case Management Discharge Plan :  Will you be returning to the same living situation after discharge:  No. Patient reports he is discharging to an [address] in [place], [place].  At discharge, do you have transportation home?: Yes,  bus passes Do you have the ability to pay for your medications: No.  Release of information consent forms completed and in the chart;  Patient's signature needed at discharge.  Patient to Follow up at: Follow-up Information    [facility]. Go to.   Specialty:  Behavioral Health Why:  CSW unable to schedule an appointment prior to discharge. Walk-in hours are Monday-Friday from 8:00am-5:00pm. Please be sure to bring your Photo ID, SSN, any insurance information and any discharge paperwork, including your list of medications.  [address] [place] [address] [phone]        [facility] Follow up.   Specialty:  Addiction Medicine Why:  Referral made 02/19/18. Please call on a daily basis for bed availability. Your referral/information has been provided to [facility] admission department.  Contact information: [address] [place] [address] [phone]           Next level of care provider has access to [facility] Link:yes  Safety Planning and Suicide Prevention discussed: Yes,  with the patient's mother  Have you used any form of tobacco in the last 30 days? (Cigarettes, Smokeless Tobacco, Cigars, and/or Pipes): Yes  Has patient been referred to the Quitline?: Patient refused referral  Patient has been referred for addiction treatment: Yes. Patient referred to [facility] and reports he will follow up rin regards to bed availability.   Maeola Sarah, LCSWA 02/19/2018, 1:33 PM

## 2018-02-19 NOTE — ED Notes (Signed)
[facility] called for transport back to [facility]

## 2018-02-19 NOTE — Progress Notes (Signed)
Another RN administered nitroglycerin 0.4 mg sublingual.  On-site provider assessed pt and order placed to transfer pt to [facility] via EMS for further evalutation.  911 called.  Charge nurse, [facility] aware.  On-site provider instructed writer not to administer more medication at this time.  Pt reports he does not want family contacted at this time.  Report called to [facility] charge nurse.  Pt left via EMS.

## 2018-02-19 NOTE — Progress Notes (Signed)
Pt returned from ED ambulatory and in no apparent distress.  He is currently resting in bed.    1:1 staff remains with pt for safety.  Pt is safe on the unit.  Will continue to monitor and assess.

## 2018-02-19 NOTE — Discharge Summary (Addendum)
Physician Discharge Summary Note  Patient:  Derek Little is an 32 y.o., male MRN:  [id number] DOB:  07/25/1985 Patient phone:  [phone] (home)  Patient address:   [address] [place] [address],  Total Time spent with patient: 20 minutes  Date of Admission:  02/16/2018 Date of Discharge: 02/19/18  Reason for Admission:  Worsening depression with SI and substance abuse  Principal Problem: Severe recurrent major depression without psychotic features [facility]) Discharge Diagnoses: Patient Active Problem List   Diagnosis Date Noted  . Severe recurrent major depression without psychotic features (HCC) [F33.2] 02/16/2018  . Polysubstance (including opioids) dependence, daily use (HCC) [F19.20] 02/01/2018  . Opioid dependence with opioid-induced mood disorder (HCC) [F11.24]   . Alcohol dependence with alcohol-induced mood disorder (HCC) [F10.24]   . MDD (major depressive disorder), recurrent severe, without psychosis (HCC) [F33.2] 01/31/2018    Past Psychiatric History: reports prior admissions for detox. As noted, had recent psychiatric admission to [facility] in August for opiate, alcohol use disorders and depression. Reports history of depression exacerbated by drug abuse and by psychosocial stressors. Denies history of psychosis, no history of mania, no history of PTSD. Has never attempted suicide, denies history of cutting.  Past Medical History:  Past Medical History:  Diagnosis Date  . CAD (coronary artery disease)     Past Surgical History:  Procedure Laterality Date  . CARDIAC SURGERY     Family History: History reviewed. No pertinent family history. Family Psychiatric  History: reports both maternal grandparents were alcoholic, no other mental illness or suicides in family  Social History:  Social History   Substance and Sexual Activity  Alcohol Use Yes     Social History   Substance and Sexual Activity  Drug Use Yes  . Types: Marijuana   Comment: Heroin  /Percocet    Social History   Socioeconomic History  . Marital status: Single    Spouse name: Not on file  . Number of children: 2  . Years of education: high school  . Highest education level: Not on file  Occupational History  . Not on file  Social Needs  . Financial resource strain: Somewhat hard  . Food insecurity:    Worry: Sometimes true    Inability: Never true  . Transportation needs:    Medical: Yes    Non-medical: Yes  Tobacco Use  . Smoking status: Current Every Day Smoker  . Smokeless tobacco: Former Neurosurgeon    Types: Snuff  Substance and Sexual Activity  . Alcohol use: Yes  . Drug use: Yes    Types: Marijuana    Comment: Heroin /Percocet  . Sexual activity: Yes    Birth control/protection: Condom  Lifestyle  . Physical activity:    Days per week: 0 days    Minutes per session: 0 min  . Stress: Rather much  Relationships  . Social connections:    Talks on phone: Never    Gets together: Never    Attends religious service: Never    Active member of club or organization: No    Attends meetings of clubs or organizations: Not on file    Relationship status: Living with partner  Other Topics Concern  . Not on file  Social History Narrative  . Not on file    [facility] Course:   02/16/18 [facility] Counselor Assessment: 32 y.o. male. Clinician reviewed note by Derek Forth, PA.  Derek Little a 32 y.o.malewith a hx ofcoronary artery disease, MI, polysubstance abuse, alcohol abusepresents  to the Emergency Department complaining of gradual, persistent, progressively worseningsuicidal ideation onset 2 days ago. Patient is tearful throughout assessment.  He talks about how he has lost everything.  He has lost his fiance, he was living with her so he is without a home now.  Pt has not seen his children either.  Pt has no car and no job now.  Patient says he has been staying with friends or in hotels over the last 3 weeks.  Patient says he has been thinking of  killing himself more and more.  He does not see another way out.  He has no specific plan at this time.  Pt has no previous suicide attempt. Pt denies any HI or A/V hallucinations. Patient says he only was able to stay clean for two days after being discharged from [facility] on 02/03/18.  Patient relapsed back into snorting heroin, using 5-10 bags of heroin daily for last two weeks.  He drinks about 4-5 shots of ETOH daily.  Smokes marijuana every other day on average.  Pt last used all of these substances yesterday (09/05).    Patient remained on the [facility] unit for 3 days. The patient stabilized on medication and therapy. Patient was discharged on Neurontin 100 mg TID, Trazodone 100 mg QHS PRN, Remeron 7.5 mg QHS, . Patient has shown improvement with improved mood, affect, sleep, appetite, and interaction. Patient has attended group and participated. Patient has been seen in the day room interacting with peers and staff appropriately. Patient denies any SI/HI/AVH and contracts for safety. [facility] Patient is provided with prescriptions for their medications upon discharge.   Physical Findings: AIMS: Facial and Oral Movements Muscles of Facial Expression: None, normal Lips and Perioral Area: None, normal Jaw: None, normal Tongue: None, normal,Extremity Movements Upper (arms, wrists, hands, fingers): None, normal Lower (legs, knees, ankles, toes): None, normal, Trunk Movements Neck, shoulders, hips: None, normal, Overall Severity Severity of abnormal movements (highest score from questions above): None, normal Incapacitation due to abnormal movements: None, normal Patient's awareness of abnormal movements (rate only patient's report): No Awareness, Dental Status Current problems with teeth and/or dentures?: No Does patient usually wear dentures?: No  CIWA:  CIWA-Ar Total: 2 COWS:  COWS Total Score: 8  Musculoskeletal: Strength & Muscle Tone: within normal  limits Gait & Station: normal Patient leans: N/A  Psychiatric Specialty Exam: Physical Exam  Nursing note and vitals reviewed. Constitutional: He is oriented to person, place, and time. He appears well-developed and well-nourished.  Cardiovascular: Normal rate.  Respiratory: Effort normal.  Musculoskeletal: Normal range of motion.  Neurological: He is alert and oriented to person, place, and time.  Skin: Skin is warm.    Review of Systems  Constitutional: Negative.   HENT: Negative.   Eyes: Negative.   Respiratory: Negative.   Cardiovascular: Negative.   Gastrointestinal: Negative.   Genitourinary: Negative.   Musculoskeletal: Negative.   Skin: Negative.   Neurological: Negative.   Endo/Heme/Allergies: Negative.   Psychiatric/Behavioral: Negative.     Blood pressure (!) 116/98, pulse 76, temperature 97.9 F (36.6 C), temperature source Oral, resp. rate 20, height 5\' 8"  (1.727 m), weight 79.4 kg, SpO2 100 %.Body mass index is 26.61 kg/m.  General Appearance: Casual  Eye Contact:  Good  Speech:  Clear and Coherent and Normal Rate  Volume:  Normal  Mood:  Euthymic  Affect:  Appropriate  Thought Process:  Goal Directed and Descriptions of Associations: Intact  Orientation:  Full (Time, Place, and Person)  Thought Content:  WDL  Suicidal Thoughts:  No  Homicidal Thoughts:  No  Memory:  Immediate;   Good Recent;   Good Remote;   Good  Judgement:  Fair  Insight:  Fair  Psychomotor Activity:  Normal  Concentration:  Concentration: Fair and Attention Span: Fair  Recall:  Good  Fund of Knowledge:  Good  Language:  Good  Akathisia:  No  Handed:  Right  AIMS (if indicated):     Assets:  Communication Skills Desire for Improvement Physical Health Social Support Transportation  ADL's:  Intact  Cognition:  WNL  Sleep:  Number of Hours: 6.75     Have you used any form of tobacco in the last 30 days? (Cigarettes, Smokeless Tobacco, Cigars, and/or Pipes): Yes  Has  this patient used any form of tobacco in the last 30 days? (Cigarettes, Smokeless Tobacco, Cigars, and/or Pipes) Yes, Yes, A prescription for an FDA-approved tobacco cessation medication was offered at discharge and the patient refused  Blood Alcohol level:  Lab Results  Component Value Date   ETH <10 02/16/2018   ETH <10 01/31/2018    Metabolic Disorder Labs:  No results found for: HGBA1C, MPG No results found for: PROLACTIN No results found for: CHOL, TRIG, HDL, CHOLHDL, VLDL, LDLCALC  See Psychiatric Specialty Exam and Suicide Risk Assessment completed by Attending Physician prior to discharge.  Discharge destination:  Home  Is patient on multiple antipsychotic therapies at discharge:  No   Has Patient had three or more failed trials of antipsychotic monotherapy by history:  No  Recommended Plan for Multiple Antipsychotic Therapies: NA   Allergies as of 02/19/2018   No Known Allergies     Medication List    STOP taking these medications   QUEtiapine 25 MG tablet Commonly known as:  SEROQUEL   sertraline 25 MG tablet Commonly known as:  ZOLOFT     TAKE these medications     Indication  aspirin 81 MG chewable tablet Chew 81 mg by mouth daily.  Indication:  Per PCP   atorvastatin 10 MG tablet Commonly known as:  LIPITOR Take 1 tablet (10 mg total) by mouth daily at 6 PM. For high cholesterol  Indication:  High Amount of Fats in the Blood   gabapentin 100 MG capsule Commonly known as:  NEURONTIN Take 1 capsule (100 mg total) by mouth 3 (three) times daily. Withdrawal symptoms What changed:    medication strength  how much to take  additional instructions  Indication:  Alcohol Withdrawal Syndrome, Substance withdrawal syndrome.   hydrOXYzine 50 MG tablet Commonly known as:  ATARAX/VISTARIL Take 1 tablet (50 mg total) by mouth 3 (three) times daily as needed for anxiety.  Indication:  Feeling Anxious   Metoprolol Tartrate 37.5 MG Tabs Take 37.5 mg by  mouth 2 (two) times daily. For high blood pressure What changed:    medication strength  additional instructions  Indication:  High Blood Pressure Disorder   mirtazapine 7.5 MG tablet Commonly known as:  REMERON Take 1 tablet (7.5 mg total) by mouth at bedtime. For sleep and mood control  Indication:  mood and sleep   nicotine 21 mg/24hr patch Commonly known as:  NICODERM CQ - dosed in mg/24 hours Place 1 patch (21 mg total) onto the skin daily.  Indication:  Nicotine Addiction   nitroGLYCERIN 0.4 MG SL tablet Commonly known as:  NITROSTAT Place 1 tablet (0.4 mg total) under the tongue every 5 (five)  minutes as needed for chest pain.  Indication:  Acute Heart Attack, Congestive Heart Failure   traZODone 100 MG tablet Commonly known as:  DESYREL Take 1 tablet (100 mg total) by mouth at bedtime and may repeat dose one time if needed.  Indication:  Trouble Sleeping      Follow-up [facility] [facility]. Go to.   Specialty:  Behavioral Health Why:  CSW unable to schedule an appointment prior to discharge. Walk-in hours are Monday-Friday from 8:00am-5:00pm. Please be sure to bring your Photo ID, SSN, any insurance information and any discharge paperwork, including your list of medications.  [address] [place] [address] [phone]        [facility] Follow up.   Specialty:  Addiction Medicine Why:  Referral made 02/19/18. Please call on a daily basis for bed availability. Your referral/information has been provided to [facility] admission department.  Contact information: [address] [place] [address] [phone]           Follow-up recommendations:  Continue activity as tolerated. Continue diet as recommended by your PCP. Ensure to keep all appointments with outpatient providers.  Comments:  Patient is instructed prior to discharge to: Take all medications as prescribed by his/her mental healthcare  provider. Report any adverse effects and or reactions from the medicines to his/her outpatient provider promptly. Patient has been instructed & cautioned: To not engage in alcohol and or illegal drug use while on prescription medicines. In the event of worsening symptoms, patient is instructed to call the crisis hotline, 911 and or go to the nearest ED for appropriate evaluation and treatment of symptoms. To follow-up with his/her primary care provider for your other medical issues, concerns and or health care needs.    Signed: Gerlene Burdock Money, FNP 02/19/2018, 1:42 PM    Patient seen, Suicide Assessment Completed.  Disposition Plan Reviewed

## 2018-02-19 NOTE — ED Notes (Signed)
EMT at bedside for repeat trop draw

## 2018-02-19 NOTE — Progress Notes (Signed)
Patient's 1:1 has been discontinued. Patient's behavior is appropriate. Respirations are even and unlabored. Patient is in no distress. Safety is in place. Will continue to monitor.

## 2018-02-19 NOTE — Tx Team (Signed)
Interdisciplinary Treatment and Diagnostic Plan Update  02/19/2018 Time of Session: 9:40am Leam Madero MRN: [id number]  Principal Diagnosis: Severe recurrent major depression without psychotic features [facility])  Secondary Diagnoses: Principal Problem:   Severe recurrent major depression without psychotic features ([place])   Current Medications:  Current Facility-Administered Medications  Medication Dose Route Frequency Provider Last Rate Last Dose  . acetaminophen (TYLENOL) tablet 650 mg  650 mg Oral Q6H PRN Lindon Romp A, NP   650 mg at 02/17/18 2340  . alum & mag hydroxide-simeth (MAALOX/MYLANTA) 200-200-20 MG/5ML suspension 30 mL  30 mL Oral Q4H PRN Lindon Romp A, NP      . aspirin EC tablet 81 mg  81 mg Oral Daily Cobos, Myer Peer, MD   81 mg at 02/18/18 0901  . atorvastatin (LIPITOR) tablet 10 mg  10 mg Oral q1800 Cobos, Myer Peer, MD   10 mg at 02/18/18 1602  . chlordiazePOXIDE (LIBRIUM) capsule 25 mg  25 mg Oral Q6H PRN Cobos, Myer Peer, MD   25 mg at 02/17/18 2340  . gabapentin (NEURONTIN) capsule 100 mg  100 mg Oral TID Cobos, Myer Peer, MD   100 mg at 02/18/18 1602  . hydrocortisone cream 1 %   Topical TID Money, Lowry Ram, FNP      . hydrOXYzine (ATARAX/VISTARIL) tablet 50 mg  50 mg Oral TID PRN Nanci Pina, FNP   50 mg at 02/18/18 2208  . loperamide (IMODIUM) capsule 2-4 mg  2-4 mg Oral PRN Cobos, Myer Peer, MD      . magnesium hydroxide (MILK OF MAGNESIA) suspension 30 mL  30 mL Oral Daily PRN Lindon Romp A, NP      . metoprolol tartrate (LOPRESSOR) tablet 37.5 mg  37.5 mg Oral BID Lindon Romp A, NP   37.5 mg at 02/18/18 1600  . mirtazapine (REMERON) tablet 7.5 mg  7.5 mg Oral QHS Cobos, Myer Peer, MD   7.5 mg at 02/18/18 2143  . multivitamin with minerals tablet 1 tablet  1 tablet Oral Daily Cobos, Myer Peer, MD   1 tablet at 02/18/18 0901  . nicotine (NICODERM CQ - dosed in mg/24 hours) patch 21 mg  21 mg Transdermal Daily Lindon Romp A, NP   Stopped at 02/17/18  0915  . nitroGLYCERIN (NITROSTAT) SL tablet 0.4 mg  0.4 mg Sublingual Q5 min PRN Lindon Romp A, NP   0.4 mg at 02/17/18 2351  . thiamine (B-1) injection 100 mg  100 mg Intramuscular Once Cobos, Fernando A, MD      . thiamine (VITAMIN B-1) tablet 100 mg  100 mg Oral Daily Cobos, Myer Peer, MD   100 mg at 02/18/18 0902  . traZODone (DESYREL) tablet 100 mg  100 mg Oral QHS,MR X 1 Starkes, Takia S, FNP   100 mg at 02/18/18 2228   PTA Medications: Medications Prior to Admission  Medication Sig Dispense Refill Last Dose  . aspirin 81 MG chewable tablet Chew 81 mg by mouth daily.   02/16/2018 at Unknown time  . gabapentin (NEURONTIN) 300 MG capsule Take 1 capsule (300 mg total) by mouth 3 (three) times daily. 90 capsule 0 02/16/2018 at Unknown time  . metoprolol tartrate (LOPRESSOR) 25 MG tablet Take 37.5 mg by mouth 2 (two) times daily.   02/16/2018 at Unknown time  . nicotine (NICODERM CQ - DOSED IN MG/24 HOURS) 21 mg/24hr patch Place 1 patch (21 mg total) onto the skin daily. 28 patch 0 02/16/2018 at Unknown time  . QUEtiapine (  SEROQUEL) 25 MG tablet Take 1 tablet (25 mg total) by mouth at bedtime. 30 tablet 0 02/16/2018 at Unknown time  . sertraline (ZOLOFT) 25 MG tablet Take 25 mg by mouth daily.   02/16/2018 at Unknown time  . nitroGLYCERIN (NITROSTAT) 0.4 MG SL tablet Place 0.4 mg under the tongue every 5 (five) minutes as needed for chest pain.   More than a month at Unknown time  . traZODone (DESYREL) 100 MG tablet Take 1 tablet (100 mg total) by mouth at bedtime and may repeat dose one time if needed. 30 tablet 0 More than a month at Unknown time    Patient Stressors: Educational concerns Financial difficulties  Patient Strengths: Ability for insight Active sense of humor  Treatment Modalities: Medication Management, Group therapy, Case management,  1 to 1 session with clinician, Psychoeducation, Recreational therapy.   Physician Treatment Plan for Primary Diagnosis: Severe recurrent major  depression without psychotic features ([place]) Long Term Goal(s): Improvement in symptoms so as ready for discharge Improvement in symptoms so as ready for discharge   Short Term Goals: Ability to identify triggers associated with substance abuse/mental health issues will improve Ability to identify changes in lifestyle to reduce recurrence of condition will improve Ability to verbalize feelings will improve Ability to disclose and discuss suicidal ideas Ability to demonstrate self-control will improve Ability to identify and develop effective coping behaviors will improve Ability to maintain clinical measurements within normal limits will improve  Medication Management: Evaluate patient's response, side effects, and tolerance of medication regimen.  Therapeutic Interventions: 1 to 1 sessions, Unit Group sessions and Medication administration.  Evaluation of Outcomes: Not Met  Physician Treatment Plan for Secondary Diagnosis: Principal Problem:   Severe recurrent major depression without psychotic features ([place])  Long Term Goal(s): Improvement in symptoms so as ready for discharge Improvement in symptoms so as ready for discharge   Short Term Goals: Ability to identify triggers associated with substance abuse/mental health issues will improve Ability to identify changes in lifestyle to reduce recurrence of condition will improve Ability to verbalize feelings will improve Ability to disclose and discuss suicidal ideas Ability to demonstrate self-control will improve Ability to identify and develop effective coping behaviors will improve Ability to maintain clinical measurements within normal limits will improve     Medication Management: Evaluate patient's response, side effects, and tolerance of medication regimen.  Therapeutic Interventions: 1 to 1 sessions, Unit Group sessions and Medication administration.  Evaluation of Outcomes: Not Met   RN Treatment Plan for Primary  Diagnosis: Severe recurrent major depression without psychotic features ([place]) Long Term Goal(s): Knowledge of disease and therapeutic regimen to maintain health will improve  Short Term Goals: Ability to participate in decision making will improve, Ability to disclose and discuss suicidal ideas, Ability to identify and develop effective coping behaviors will improve and Compliance with prescribed medications will improve  Medication Management: RN will administer medications as ordered by provider, will assess and evaluate patient's response and provide education to patient for prescribed medication. RN will report any adverse and/or side effects to prescribing provider.  Therapeutic Interventions: 1 on 1 counseling sessions, Psychoeducation, Medication administration, Evaluate responses to treatment, Monitor vital signs and CBGs as ordered, Perform/monitor CIWA, COWS, AIMS and Fall Risk screenings as ordered, Perform wound care treatments as ordered.  Evaluation of Outcomes: Not Met   LCSW Treatment Plan for Primary Diagnosis: Severe recurrent major depression without psychotic features ([place]) Long Term Goal(s): Safe transition to appropriate next level of care at  discharge, Engage patient in therapeutic group addressing interpersonal concerns.  Short Term Goals: Engage patient in aftercare planning with referrals and resources and Increase skills for wellness and recovery  Therapeutic Interventions: Assess for all discharge needs, 1 to 1 time with Social worker, Explore available resources and support systems, Assess for adequacy in community support network, Educate family and significant other(s) on suicide prevention, Complete Psychosocial Assessment, Interpersonal group therapy.  Evaluation of Outcomes: Not Met   Progress in Treatment: Attending groups: Yes. Participating in groups: Yes. Taking medication as prescribed: Yes. Toleration medication: Yes. Family/Significant other contact  made: Yes, individual(s) contacted:  the patient's mother Patient understands diagnosis: Yes. Discussing patient identified problems/goals with staff: Yes. Medical problems stabilized or resolved: Yes. Denies suicidal/homicidal ideation: No. Issues/concerns per patient self-inventory: No. Other:  New problem(s) identified: None   New Short Term/Long Term Goal(s):Detox, medication stabilization, elimination of SI thoughts, development of comprehensive mental wellness plan.   Patient Goals:  I got to get my life straight. I don't want to die  Discharge Plan or Barriers: CSW will assess for appropriate referrals and assist in arranging discharge plans.   Reason for Continuation of Hospitalization: Depression Medication stabilization Suicidal ideation Withdrawal symptoms  Estimated Length of Stay: 3-5 days   Attendees: Patient: 02/19/2018 8:41 AM  Physician: Dr. Neita Garnet, MD 02/19/2018 8:41 AM  Nursing: Alric Quan  02/19/2018 8:41 AM  RN Care Manager:X 02/19/2018 8:41 AM  Social Worker: Radonna Ricker, [place] 02/19/2018 8:41 AM  Recreational Therapist: Rhunette Croft 02/19/2018 8:41 AM  Other: Marvia Pickles, NP 02/19/2018 8:41 AM  Other: Elmarie Shiley, NP 02/19/2018 8:41 AM  Other:X 02/19/2018 8:41 AM    Scribe for Treatment Team: Marylee Floras, [place] 02/19/2018 8:41 AM

## 2018-02-19 NOTE — ED Notes (Signed)
Pt hooked up to monitor but Pt refused to take his shirt off and put on a gown.

## 2018-02-19 NOTE — ED Notes (Addendum)
Pt cleared to eat & drink by MD. Pt given sandwich meal and ginger ale per request.

## 2018-02-19 NOTE — ED Notes (Signed)
Pt requesting to d/c further testing and be discharged back to [facility]. Dr. Eudelia Bunch in to speak with patient, pt agreed to stay and have repeat trop at 5.  BH sitter at bedside

## 2018-02-19 NOTE — Progress Notes (Signed)
Pt was brought over to the quiet room from 300 hall , pt was standing in the doorway requesting to talk to the provider, after the night PA just finished talking to him, when he was told the provider was out seeinig another pt , pt stated " I need my nitro, I'm having a heart attack". Pt nurse was informed and vital signs taken , and the provider was notified. Pt continued to state" You going to call 911"

## 2018-02-19 NOTE — Progress Notes (Signed)
Patient ID: [facility], male   DOB: 1986/01/02, 32 y.o.   MRN: [id number]  Discharge Note  D) Patient discharged to lobby. Patient states readiness for discharge. Patient denies SI/HI, AVH and is not delusional or psychotic. Patient in no acute distress.   A) Written and verbal discharge instructions given to the patient. Patient accepting to information and verbalized understanding. Patient agrees to the discharge plan. Opportunity for questions and concerns presented to patient. Patient denied any further questions or concerns. All belongings returned to patient. Patient signed for return of belongings and discharge paperwork.   R) Patient safely escorted to the lobby. Patient discharged from [facility] with prescriptions, personal belongings, follow-up appointment in place and discharge paperwork.

## 2018-02-19 NOTE — Progress Notes (Signed)
Recreation Therapy Notes  Date: 9.9.19 Time: 0930 Location: [address] Dayroom  Group Topic: Stress Management  Goal Area(s) Addresses:  Patient will verbalize importance of using healthy stress management.  Patient will identify positive emotions associated with healthy stress management.   Intervention: Stress Management  Activity :  Mountain Meditation.  LRT introduced the stress management technique of meditation.  Patients were to listen as meditation played to engage in the activity.  Education: Stress Management, Discharge Planning.   Education Outcome: Acknowledges edcuation/In group clarification offered/Needs additional education  Clinical Observations/Feedback: Pt did not attend group.    Jeanann Balinski, LRT/CTRS       Zachary Nole A 02/19/2018 12:09 PM 

## 2018-02-19 NOTE — Plan of Care (Signed)
  Problem: Education: Goal: Verbalization of understanding the information provided will improve Outcome: Not Progressing   Problem: Activity: Goal: Interest or engagement in activities will improve Outcome: Not Progressing   Problem: Activity: Goal: Sleeping patterns will improve Outcome: Not Progressing

## 2018-06-22 ENCOUNTER — Emergency Department (HOSPITAL_COMMUNITY)
Admission: EM | Admit: 2018-06-22 | Discharge: 2018-06-22 | Disposition: A | Discharge status: Home / Self Care | Payer: Medicaid Other | Attending: Emergency Medicine | Admitting: Emergency Medicine

## 2018-06-22 DIAGNOSIS — I2510 Atherosclerotic heart disease of native coronary artery without angina pectoris: Secondary | ICD-10-CM | POA: Insufficient documentation

## 2018-06-22 DIAGNOSIS — X58XXXA Exposure to other specified factors, initial encounter: Secondary | ICD-10-CM | POA: Diagnosis not present

## 2018-06-22 DIAGNOSIS — F17200 Nicotine dependence, unspecified, uncomplicated: Secondary | ICD-10-CM | POA: Diagnosis not present

## 2018-06-22 DIAGNOSIS — K047 Periapical abscess without sinus: Secondary | ICD-10-CM | POA: Diagnosis not present

## 2018-06-22 DIAGNOSIS — Y9389 Activity, other specified: Secondary | ICD-10-CM | POA: Insufficient documentation

## 2018-06-22 DIAGNOSIS — Y929 Unspecified place or not applicable: Secondary | ICD-10-CM | POA: Diagnosis not present

## 2018-06-22 DIAGNOSIS — S025XXA Fracture of tooth (traumatic), initial encounter for closed fracture: Secondary | ICD-10-CM | POA: Diagnosis not present

## 2018-06-22 DIAGNOSIS — K0889 Other specified disorders of teeth and supporting structures: Secondary | ICD-10-CM

## 2018-06-22 DIAGNOSIS — S0993XA Unspecified injury of face, initial encounter: Secondary | ICD-10-CM | POA: Diagnosis present

## 2018-06-22 DIAGNOSIS — Y998 Other external cause status: Secondary | ICD-10-CM | POA: Diagnosis not present

## 2018-06-22 MED ORDER — AMOXICILLIN 500 MG PO CAPS: 500.0000 mg | ORAL_CAPSULE | Freq: Three times a day (TID) | ORAL | 0 refills | Status: DC

## 2018-06-22 NOTE — Discharge Instructions (Addendum)
Call Dr. Leanord Asal' office to schedule follow up. Tell them you were seen in the ED and he was on call.

## 2018-06-22 NOTE — ED Triage Notes (Signed)
Cracked tooth 3 months ago and reports that pain is increasingly getting worse. Pain to left upper jaw. Pain 9/10, mild swelling.

## 2018-06-22 NOTE — ED Provider Notes (Signed)
[facility]-EMERGENCY DEPT Provider Note   CSN: [id number] Arrival date & time: 06/22/18  1507     History   Chief Complaint Chief Complaint  Patient presents with  . Dental Pain    HPI Derek Little is a 33 y.o. male who presents to the ED with dental pain. Patient reports that 3 months ago he broke a tooth and it has been getting progressively worse with pain to the left upper dental area. Pain has become severe over the past 24 hours. Patient rates the pain as 9/10. Patient with hx of MI a few months ago and hx of substance abuse. States he does not want narcotics.   The history is provided by the patient. No language interpreter was used.  Dental Pain  Location:  Upper Upper teeth location:  12/LU 1st bicuspid Quality:  Sharp and throbbing Severity:  Severe Onset quality:  Gradual Duration:  1 day Timing:  Constant Progression:  Worsening Context: dental caries and dental fracture   Relieved by:  Nothing Worsened by:  Cold food/drink and pressure Ineffective treatments: ASA. Associated symptoms: facial pain   Associated symptoms: no difficulty swallowing, no facial swelling, no fever, no gum swelling and no trismus   Risk factors: lack of dental care and smoking     Past Medical History:  Diagnosis Date  . CAD (coronary artery disease)     Patient Active Problem List   Diagnosis Date Noted  . Severe recurrent major depression without psychotic features (HCC) 02/16/2018  . Polysubstance (including opioids) dependence, daily use (HCC) 02/01/2018  . Opioid dependence with opioid-induced mood disorder (HCC)   . Alcohol dependence with alcohol-induced mood disorder (HCC)   . MDD (major depressive disorder), recurrent severe, without psychosis (HCC) 01/31/2018    Past Surgical History:  Procedure Laterality Date  . CARDIAC SURGERY          Home Medications    Prior to Admission medications   Medication Sig Start Date End Date Taking?  Authorizing Provider  amoxicillin (AMOXIL) 500 MG capsule Take 1 capsule (500 mg total) by mouth 3 (three) times daily. 06/22/18   Janne Napoleon, NP  aspirin 81 MG chewable tablet Chew 81 mg by mouth daily.    [provider]  atorvastatin (LIPITOR) 10 MG tablet Take 1 tablet (10 mg total) by mouth daily at 6 PM. For high cholesterol 02/19/18   Money, Gerlene Burdock, FNP  gabapentin (NEURONTIN) 100 MG capsule Take 1 capsule (100 mg total) by mouth 3 (three) times daily. Withdrawal symptoms 02/19/18   Money, Gerlene Burdock, FNP  hydrOXYzine (ATARAX/VISTARIL) 50 MG tablet Take 1 tablet (50 mg total) by mouth 3 (three) times daily as needed for anxiety. 02/19/18   Money, Gerlene Burdock, FNP  metoprolol tartrate 37.5 MG TABS Take 37.5 mg by mouth 2 (two) times daily. For high blood pressure 02/19/18   Money, Feliz Beam B, FNP  mirtazapine (REMERON) 7.5 MG tablet Take 1 tablet (7.5 mg total) by mouth at bedtime. For sleep and mood control 02/19/18   Money, Feliz Beam B, FNP  nicotine (NICODERM CQ - DOSED IN MG/24 HOURS) 21 mg/24hr patch Place 1 patch (21 mg total) onto the skin daily. 02/03/18   Oneta Rack, NP  nitroGLYCERIN (NITROSTAT) 0.4 MG SL tablet Place 1 tablet (0.4 mg total) under the tongue every 5 (five) minutes as needed for chest pain. 02/19/18   Money, Gerlene Burdock, FNP  traZODone (DESYREL) 100 MG tablet Take 1 tablet (100 mg total)  by mouth at bedtime and may repeat dose one time if needed. 02/19/18   Money, Gerlene Burdock, FNP    Family History No family history on file.  Social History Social History   Tobacco Use  . Smoking status: Current Every Day Smoker  . Smokeless tobacco: Former Neurosurgeon    Types: Snuff  Substance Use Topics  . Alcohol use: Yes  . Drug use: Yes    Types: Marijuana    Comment: Heroin /Percocet     Allergies   Patient has no known allergies.   Review of Systems Review of Systems  Constitutional: Negative for fever.  HENT: Positive for dental problem. Negative for facial swelling.   All  other systems reviewed and are negative.    Physical Exam Updated Vital Signs BP (!) 141/73   Pulse 82   Temp 98.5 F (36.9 C) (Oral)   Resp 16   SpO2 95%   Physical Exam Vitals signs and nursing note reviewed.  Constitutional:      General: He is not in acute distress.    Appearance: He is well-developed and normal weight.  HENT:     Head: Normocephalic.     Jaw: No trismus.     Comments: Left upper dental area with broken/decayed tooth. Tender on exam. No abscess noted.     Mouth/Throat:     Mouth: Mucous membranes are moist.  Eyes:     Extraocular Movements: Extraocular movements intact.     Conjunctiva/sclera: Conjunctivae normal.  Neck:     Musculoskeletal: Neck supple.  Cardiovascular:     Rate and Rhythm: Normal rate.  Pulmonary:     Effort: Pulmonary effort is normal.  Abdominal:     Tenderness: There is abdominal tenderness.  Musculoskeletal: Normal range of motion.  Lymphadenopathy:     Cervical: No cervical adenopathy.  Skin:    General: Skin is warm and dry.  Neurological:     Mental Status: He is alert and oriented to person, place, and time.     Cranial Nerves: No cranial nerve deficit.  Psychiatric:        Mood and Affect: Mood normal.      ED Treatments / Results  Labs (all labs ordered are listed, but only abnormal results are displayed) Labs Reviewed - No data to display Radiology No results found.  Procedures Procedures (including critical care time)  Medications Ordered in ED Medications - No data to display   Initial Impression / Assessment and Plan / ED Course  I have reviewed the triage vital signs and the nursing notes. Patient with toothache.  No gross abscess.  Exam unconcerning for Ludwig's angina or spread of infection.  Will treat with Amoxicillin and tylenol.  Urged patient to follow-up with dentist. Referral given.   Final Clinical Impressions(s) / ED Diagnoses   Final diagnoses:  Closed fracture of tooth, initial  encounter  Pain, dental  Dental infection    ED Discharge Orders         Ordered    amoxicillin (AMOXIL) 500 MG capsule  3 times daily     06/22/18 1609           Kerrie Buffalo [place], [place] 06/22/18 1614    Doug Sou, MD 06/22/18 [phone]

## 2018-08-13 ENCOUNTER — Encounter (HOSPITAL_COMMUNITY): Payer: Self-pay | Admitting: Emergency Medicine

## 2018-08-13 ENCOUNTER — Other Ambulatory Visit: Payer: Self-pay

## 2018-08-13 ENCOUNTER — Emergency Department (HOSPITAL_COMMUNITY)
Admission: EM | Admit: 2018-08-13 | Discharge: 2018-08-13 | Disposition: A | Discharge status: Home / Self Care | Payer: Medicaid Other | Attending: Emergency Medicine | Admitting: Emergency Medicine

## 2018-08-13 ENCOUNTER — Emergency Department (HOSPITAL_COMMUNITY): Payer: Medicaid Other

## 2018-08-13 DIAGNOSIS — R079 Chest pain, unspecified: Secondary | ICD-10-CM | POA: Diagnosis present

## 2018-08-13 DIAGNOSIS — Z5321 Procedure and treatment not carried out due to patient leaving prior to being seen by health care provider: Secondary | ICD-10-CM | POA: Insufficient documentation

## 2018-08-13 NOTE — ED Notes (Signed)
Pt not answering for xray

## 2018-08-13 NOTE — ED Notes (Signed)
Not answering for blood work

## 2018-08-13 NOTE — ED Triage Notes (Signed)
Pt c/o general chest tightness for the past couple days. Reports had heart surgery back in April at [facility]. Pt reports dizziness with sitting to standing the past couple days.

## 2019-03-09 ENCOUNTER — Other Ambulatory Visit: Payer: Self-pay

## 2019-03-09 ENCOUNTER — Encounter (HOSPITAL_COMMUNITY): Payer: Self-pay | Admitting: *Deleted

## 2019-03-09 DIAGNOSIS — F410 Panic disorder [episodic paroxysmal anxiety] without agoraphobia: Secondary | ICD-10-CM | POA: Diagnosis present

## 2019-03-09 DIAGNOSIS — Z5321 Procedure and treatment not carried out due to patient leaving prior to being seen by health care provider: Secondary | ICD-10-CM | POA: Insufficient documentation

## 2019-03-09 DIAGNOSIS — R202 Paresthesia of skin: Secondary | ICD-10-CM | POA: Insufficient documentation

## 2019-03-09 NOTE — ED Notes (Signed)
Went back to triage 3 to speak with patient about plan, give him a phone to call his mother and give him water, pt is not in room, EKG leads/BP cuff are on the floor. EMT stating they saw pt walking out of triage. Not visible in lobby

## 2019-03-09 NOTE — ED Triage Notes (Signed)
Pt arrives stating he thinks he is having an anxiety attack. Pt is very anxious and fidgety in triage. Reports that he used Heroin 2 days ago and took his Suboxone last night. Pt denies pain, just says that he feels like his arms and legs "are tingling". Denies SI or HI.

## 2019-03-10 ENCOUNTER — Emergency Department (HOSPITAL_COMMUNITY)
Admission: EM | Admit: 2019-03-10 | Discharge: 2019-03-10 | Discharge status: Against Medical Advice | Payer: Medicaid Other | Attending: Emergency Medicine | Admitting: Emergency Medicine

## 2019-03-10 HISTORY — DX: Other psychoactive substance abuse, uncomplicated: F19.10

## 2019-04-18 IMAGING — DX DG CHEST 2V
2 series · 2 of 2 positions shown · non-contrast
Comparison: 08/31/2009

CLINICAL DATA: Chest pain. Mid chest tightness. Pain resolved with
nitroglycerin. History of myocardial infarct in Monday September, 2017.
Shortness of breath for a month.

EXAM:
CHEST - 2 VIEW

[chest pa]
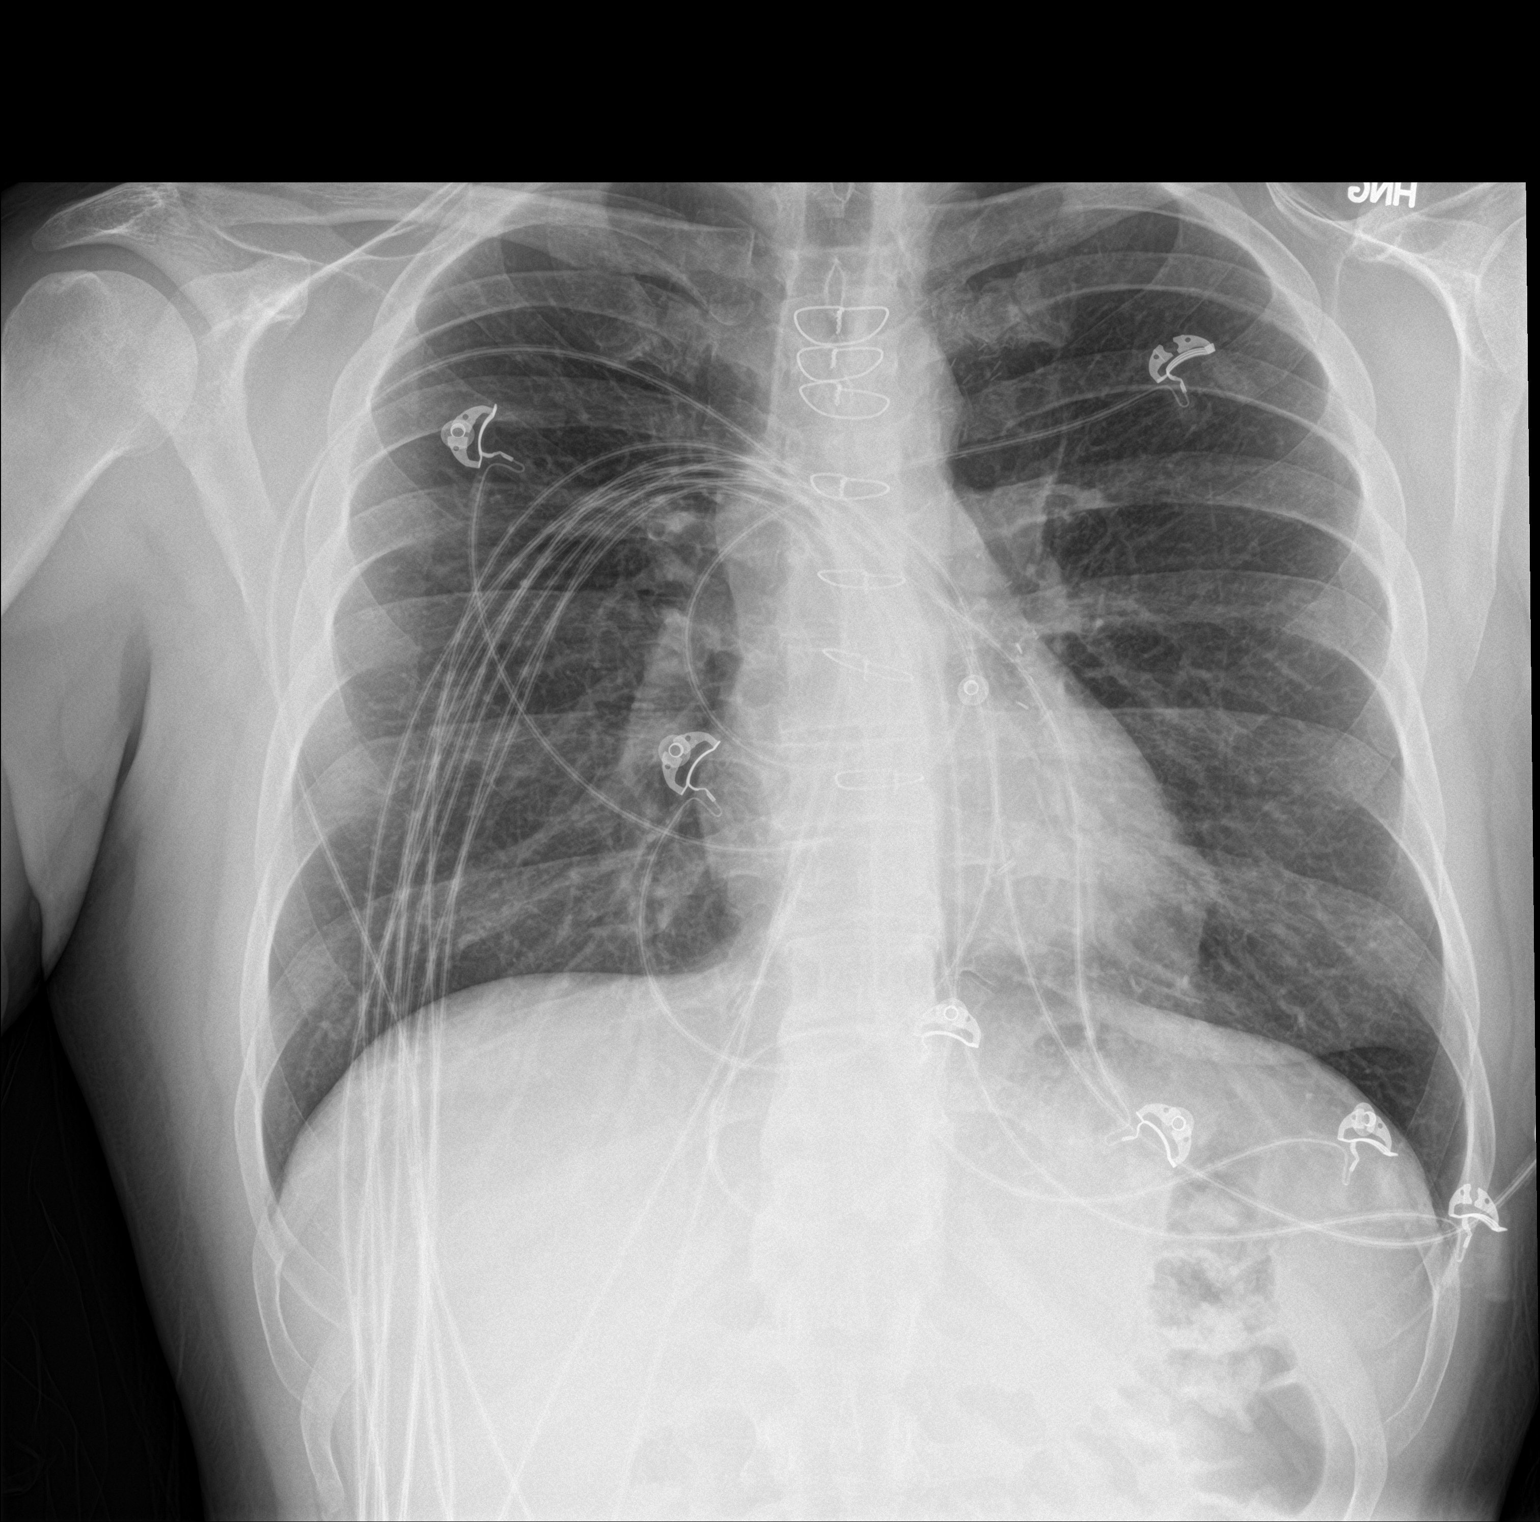

[chest lat]
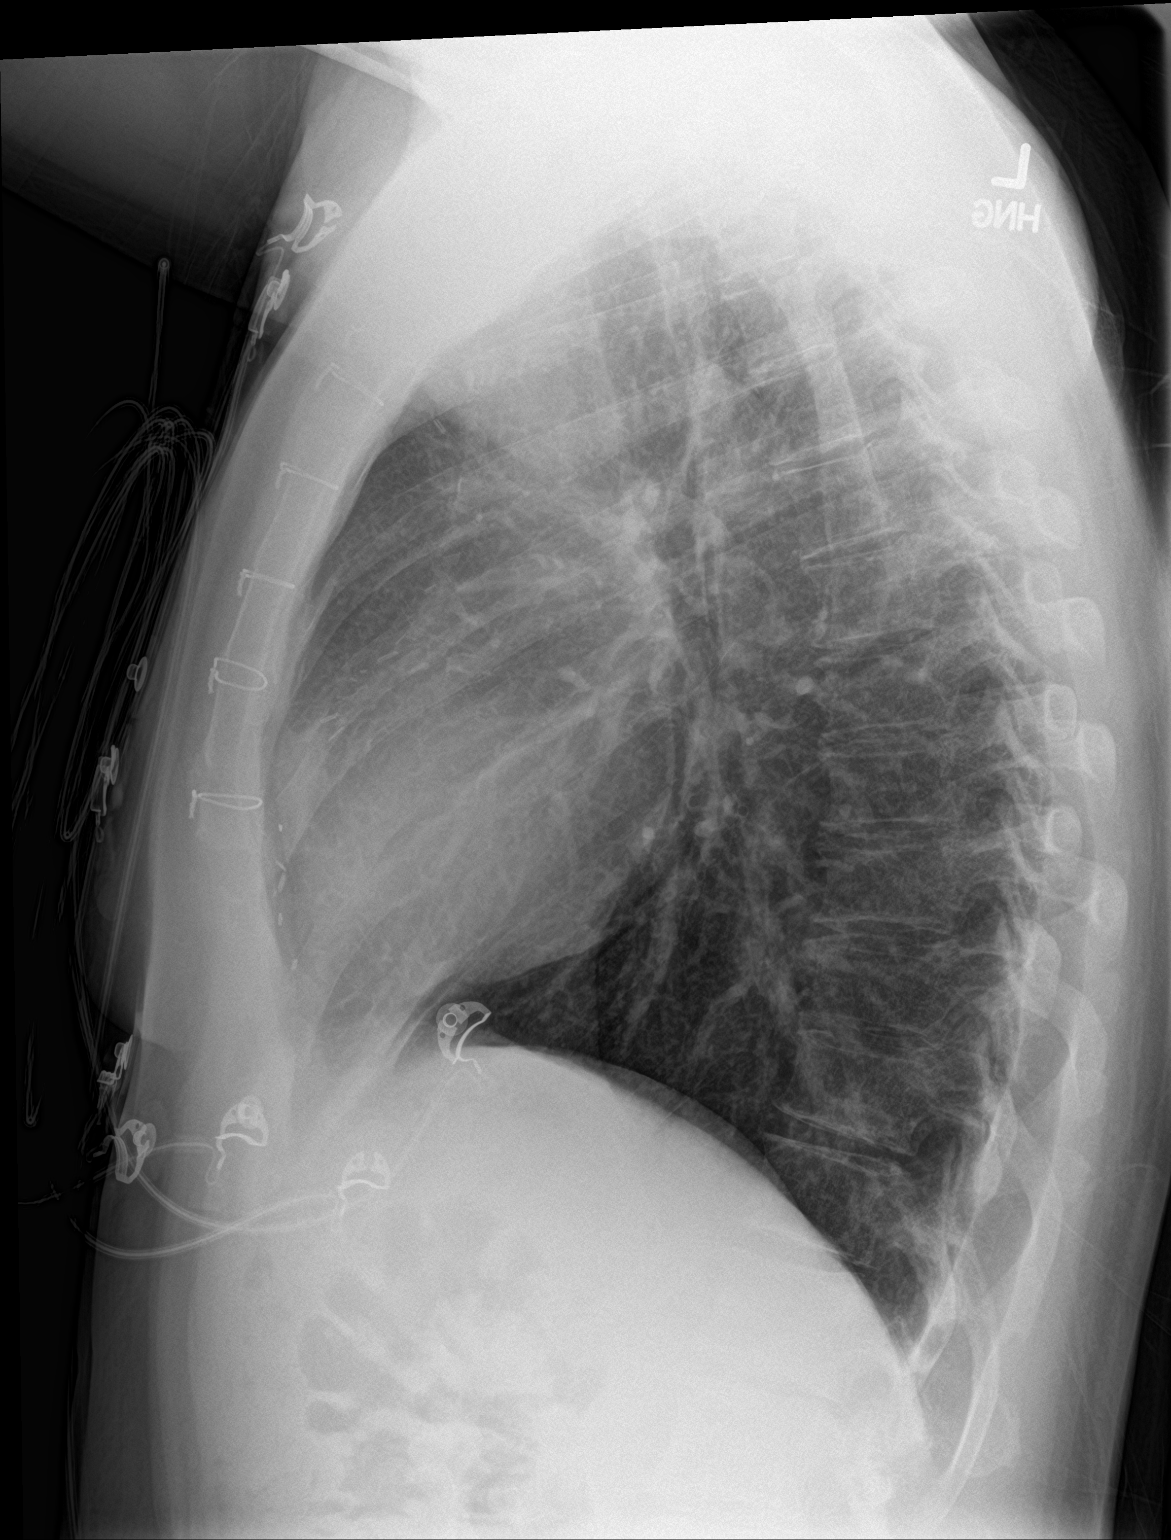

[2 of 2 positions shown; findings below may reference images not displayed]

FINDINGS: Postoperative changes in the mediastinum. Normal heart size and
pulmonary vascularity. No focal airspace disease or consolidation in
the lungs. No blunting of costophrenic angles. No pneumothorax.
Mediastinal contours appear intact.
IMPRESSION: No active cardiopulmonary disease.

## 2020-01-09 ENCOUNTER — Ambulatory Visit (INDEPENDENT_AMBULATORY_CARE_PROVIDER_SITE_OTHER): Payer: Medicaid Other | Admitting: Licensed Clinical Social Worker

## 2020-01-09 ENCOUNTER — Encounter (HOSPITAL_COMMUNITY): Payer: Self-pay | Admitting: Licensed Clinical Social Worker

## 2020-01-09 ENCOUNTER — Other Ambulatory Visit: Payer: Self-pay

## 2020-01-09 DIAGNOSIS — F332 Major depressive disorder, recurrent severe without psychotic features: Secondary | ICD-10-CM | POA: Diagnosis not present

## 2020-01-09 DIAGNOSIS — F1024 Alcohol dependence with alcohol-induced mood disorder: Secondary | ICD-10-CM | POA: Diagnosis not present

## 2020-01-09 DIAGNOSIS — F1124 Opioid dependence with opioid-induced mood disorder: Secondary | ICD-10-CM

## 2020-01-09 NOTE — Patient Instructions (Signed)
Opioid Use Disorder Opioid use disorder is a condition in which opioids are used for reasons other than medical care. The person may use them even though taking them hurts the person's health and well-being. These drugs are powerful substances that relieve pain. Opioids include drugs such as heroin as well as prescription medicines for pain, such as:  Codeine.  Morphine.  Hydrocodone.  Oxycodone.  Fentanyl. Taking prescribed opioids regularly can lead to dependence, especially if you take them in larger amounts or more often than they should be taken. Opioid use disorder can lead to problems with mental and physical health, including:  Depression or anxiety.  Severe constipation.  Malnutrition and weight loss.  Sleep problems.  Diseases caused by infections, such as hepatitis or HIV.  Sexual problems. Opioid use disorder can be dangerous. It increases the risk of suicide and can lead to an overdose that can be life-threatening. What are the causes? This condition is caused by taking opioids. Taking opioids again and again results in changes in the brain that make it hard to control opioid use. Many people develop this condition because they like the way they feel when they take opioids or because they get addicted to them. What increases the risk? This condition is more likely to develop in people who:  Have a family history of opioid use disorder.  Misuse other drugs.  Have a mental illness, such as depression, post-traumatic stress disorder, or antisocial personality disorder.  Begin use at an early age, such as during their teenage years. What are the signs or symptoms? Symptoms of this condition include:  Taking opioids in larger amounts or for longer periods than you want to.  Being unable to slow down or stop your use of the drug.  Spending an abnormal amount of time getting opioids, using them, or recovering from their effects.  Craving opioids.  Using opioids  in a way that interferes with work, school, social activities, and personal relationships.  Giving up or cutting down on important life activities because of opioid use.  Using opioids when it is dangerous, such as when driving a car.  Continuing to use the drug even after it has led to problems such as: ? Physical or mental health problems. ? Legal or financial troubles. ? Job loss. ? Broken relationships.  Needing more and more of an opioid to get the same effect (building up a tolerance).  Experiencing unpleasant symptoms if you do not use the opioid (withdrawal). Some symptoms of withdrawal include: ? Depression, anxiety, or feeling irritable. ? Nausea or vomiting. ? Muscle aches or spasms. ? Watery eyes. ? Trouble sleeping. ? Yawning. How is this diagnosed? This condition is diagnosed based on:  A physical exam.  Your history of opioid use.  Your symptoms. This includes: ? How opioid use affects your life. ? Changes in personality, behaviors, and mood. ? Having at least two symptoms of opioid use disorder within a 43-month period. ? Health issues related to using opioids.  Blood or urine tests to screen for drugs. How is this treated? The first goal of treatment is to stop your use of opioids. This must be done safely and may involve taking medicines to lessen withdrawal symptoms. Treatment may also involve:  Taking part in group and individual counseling from mental health providers who have experience with substance use disorder.  Staying at a residential treatment center for several days or weeks.  Attending daily counseling sessions at a treatment center.  Taking medicines as  told by your health care provider that: ? Ease symptoms and prevent complications during withdrawal. ? Block cravings and block the good feeling that you get from using opioids. ? Treat other mental health issues, such as depression or anxiety. ? Reduce agitation.  Participating in a  support group to share your experience with others who are going through the same thing.  Using opioid maintenance treatment. This involves taking certain kinds of opioid medicines. These medicines satisfy cravings but are safer than opioids that are commonly misused. Recovery can be a long process. Some people who undergo treatment start using opioids again after stopping (relapse). If you relapse, it does not mean that treatment will not work. Follow these instructions at home: Medicines  Take over-the-counter and prescription medicines only as told by your health care provider.  Check with your health care provider before starting any new medicines, herbs, or supplements. General instructions  Do not use any drugs or alcohol.  Avoid people and activities that trigger your use of opioids.  Learn and practice techniques for managing stress.  Have a plan for vulnerable moments. Get phone numbers of those who are willing to help and who are committed to your recovery.  Attend support groups regularly. These groups provide emotional support, advice, and guidance.  Keep all follow-up visits as told by your health care provider. This is important. This includes continuing to work with therapists and support groups. Where to find more information  [employer] on Drug Abuse: http://gonzalez-rivas.net/  Substance Abuse and Mental Health Services Administration: RockToxic.pl  Narcotics Anonymous: DestructiveBlog.cz Contact a health care provider if:  You cannot take your medicines as told.  Your symptoms get worse.  You have a relapse. Get help right away if:  You may have taken too much of an opioid (overdosed). Common symptoms of an overdose include: ? Sleepiness or difficulty waking from sleep. ? Decrease in attention. ? Confusion. ? Slurred speech. ? Slowed breathing and a slow pulse (bradycardia). ? Nausea and vomiting. ? Abnormally small pupils.  You have serious thoughts about hurting  yourself or others. These symptoms may represent a serious problem that is an emergency. Do not wait to see if the symptoms will go away. Get medical help right away. Call your local emergency services (911 in the [place] Do not drive yourself to the hospital. If you were prescribed a drug (naloxone) that reverses the effects of an opioid overdose, a friend, family member, or emergency services provider can administer the drug in an emergency. If you ever feel like you may hurt yourself or others, or have thoughts about taking your own life, get help right away. You can go to your nearest emergency department or call:  Your local emergency services (911 in the [place]  A suicide crisis helpline, such as the National Suicide Prevention Lifeline at (828)238-9272. This is open 24 hours a day. Summary  Opioid use disorder is a condition in which opioids are used for reasons other than medical care.  Opioid use disorder can be dangerous. It can lead to various mental and physical problems, and an opioid overdose can be life-threatening.  The first goal of treatment is to stop your use of opioids. This must be done safely and may involve taking medicines to lessen withdrawal symptoms. This information is not intended to replace advice given to you by your health care provider. Make sure you discuss any questions you have with your health care provider. Document Revised: 10/31/2018 Document Reviewed: 10/31/2018 Elsevier  Patient Education  [employer]2020 [employer]  Living With Depression Everyone experiences occasional disappointment, sadness, and loss in their lives. When you are feeling down, blue, or sad for at least 2 weeks in a row, it may mean that you have depression. Depression can affect your thoughts and feelings, relationships, daily activities, and physical health. It is caused by changes in the way your brain functions. If you receive a diagnosis of depression, your health care provider will tell  you which type of depression you have and what treatment options are available to you. If you are living with depression, there are ways to help you recover from it and also ways to prevent it from coming back. How to cope with lifestyle changes Coping with stress     Stress is your bodys reaction to life changes and events, both good and bad. Stressful situations may include:  Getting married.  The death of a spouse.  Losing a job.  Retiring.  Having a baby. Stress can last just a few hours or it can be ongoing. Stress can play a major role in depression, so it is important to learn both how to cope with stress and how to think about it differently. Talk with your health care provider or a counselor if you would like to learn more about stress reduction. He or she may suggest some stress reduction techniques, such as:  Music therapy. This can include creating music or listening to music. Choose music that you enjoy and that inspires you.  Mindfulness-based meditation. This kind of meditation can be done while sitting or walking. It involves being aware of your normal breaths, rather than trying to control your breathing.  Centering prayer. This is a kind of meditation that involves focusing on a spiritual word or phrase. Choose a word, phrase, or sacred image that is meaningful to you and that brings you peace.  Deep breathing. To do this, expand your stomach and inhale slowly through your nose. Hold your breath for 3-5 seconds, then exhale slowly, allowing your stomach muscles to relax.  Muscle relaxation. This involves intentionally tensing muscles then relaxing them. Choose a stress reduction technique that fits your lifestyle and personality. Stress reduction techniques take time and practice to develop. Set aside 5-15 minutes a day to do them. Therapists can offer training in these techniques. The training may be covered by some insurance plans. Other things you can do to manage  stress include:  Keeping a stress diary. This can help you learn what triggers your stress and ways to control your response.  Understanding what your limits are and saying no to requests or events that lead to a schedule that is too full.  Thinking about how you respond to certain situations. You may not be able to control everything, but you can control how you react.  Adding humor to your life by watching funny films or TV shows.  Making time for activities that help you relax and not feeling guilty about spending your time this way.  Medicines Your health care provider may suggest certain medicines if he or she feels that they will help improve your condition. Avoid using alcohol and other substances that may prevent your medicines from working properly (may interact). It is also important to:  Talk with your pharmacist or health care provider about all the medicines that you take, their possible side effects, and what medicines are safe to take together.  Make it your goal to take part in all treatment  decisions (shared decision-making). This includes giving input on the side effects of medicines. It is best if shared decision-making with your health care provider is part of your total treatment plan. If your health care provider prescribes a medicine, you may not notice the full benefits of it for 4-8 weeks. Most people who are treated for depression need to be on medicine for at least 6-12 months after they feel better. If you are taking medicines as part of your treatment, do not stop taking medicines without first talking to your health care provider. You may need to have the medicine slowly decreased (tapered) over time to decrease the risk of harmful side effects. Relationships Your health care provider may suggest family therapy along with individual therapy and drug therapy. While there may not be family problems that are causing you to feel depressed, it is still important to make  sure your family learns as much as they can about your mental health. Having your familys support can help make your treatment successful. How to recognize changes in your condition Everyone has a different response to treatment for depression. Recovery from major depression happens when you have not had signs of major depression for two months. This may mean that you will start to:  Have more interest in doing activities.  Feel less hopeless than you did 2 months ago.  Have more energy.  Overeat less often, or have better or improving appetite.  Have better concentration. Your health care provider will work with you to decide the next steps in your recovery. It is also important to recognize when your condition is getting worse. Watch for these signs:  Having fatigue or low energy.  Eating too much or too little.  Sleeping too much or too little.  Feeling restless, agitated, or hopeless.  Having trouble concentrating or making decisions.  Having unexplained physical complaints.  Feeling irritable, angry, or aggressive. Get help as soon as you or your family members notice these symptoms coming back. How to get support and help from others How to talk with friends and family members about your condition  Talking to friends and family members about your condition can provide you with one way to get support and guidance. Reach out to trusted friends or family members, explain your symptoms to them, and let them know that you are working with a health care provider to treat your depression. Financial resources Not all insurance plans cover mental health care, so it is important to check with your insurance carrier. If paying for co-pays or counseling services is a problem, search for a local or county mental health care center. They may be able to offer public mental health care services at low or no cost when you are not able to see a private health care provider. If you are taking  medicine for depression, you may be able to get the generic form, which may be less expensive. Some makers of prescription medicines also offer help to patients who cannot afford the medicines they need. Follow these instructions at home:   Get the right amount and quality of sleep.  Cut down on using caffeine, tobacco, alcohol, and other potentially harmful substances.  Try to exercise, such as walking or lifting small weights.  Take over-the-counter and prescription medicines only as told by your health care provider.  Eat a healthy diet that includes plenty of vegetables, fruits, whole grains, low-fat dairy products, and lean protein. Do not eat a lot of foods that are  high in solid fats, added sugars, or salt.  Keep all follow-up visits as told by your health care provider. This is important. Contact a health care provider if:  You stop taking your antidepressant medicines, and you have any of these symptoms: ? Nausea. ? Headache. ? Feeling lightheaded. ? Chills and body aches. ? Not being able to sleep (insomnia).  You or your friends and family think your depression is getting worse. Get help right away if:  You have thoughts of hurting yourself or others. If you ever feel like you may hurt yourself or others, or have thoughts about taking your own life, get help right away. You can go to your nearest emergency department or call:  Your local emergency services (911 in the [place]  A suicide crisis helpline, such as the National Suicide Prevention Lifeline at (507)504-3710. This is open 24-hours a day. Summary  If you are living with depression, there are ways to help you recover from it and also ways to prevent it from coming back.  Work with your health care team to create a management plan that includes counseling, stress management techniques, and healthy lifestyle habits. This information is not intended to replace advice given to you by your health care provider.  Make sure you discuss any questions you have with your health care provider. Document Revised: 09/21/2018 Document Reviewed: 05/02/2016 Elsevier Patient Education  2020 [employer].
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
## 2020-01-15 ENCOUNTER — Ambulatory Visit (INDEPENDENT_AMBULATORY_CARE_PROVIDER_SITE_OTHER): Payer: Medicaid Other | Admitting: Licensed Clinical Social Worker

## 2020-01-15 ENCOUNTER — Other Ambulatory Visit: Payer: Self-pay

## 2020-01-15 DIAGNOSIS — F332 Major depressive disorder, recurrent severe without psychotic features: Secondary | ICD-10-CM | POA: Diagnosis not present

## 2020-01-15 DIAGNOSIS — F1124 Opioid dependence with opioid-induced mood disorder: Secondary | ICD-10-CM | POA: Diagnosis not present

## 2020-01-15 NOTE — Progress Notes (Signed)
   THERAPIST PROGRESS NOTE  Virtual Visit via Telephone Note  I connected with Derek Little on 01/15/20 at  1:20 PM EDT by telephone and verified that I am speaking with the correct person using two identifiers.  Location: Patient: [facility]  Provider: [facility]    I discussed the limitations, risks, security and privacy concerns of performing an evaluation and management service by telephone and the availability of in person appointments. I also discussed with the patient that there may be a patient responsible charge related to this service. The patient expressed understanding and agreed to proceed.   Therapist Response:  Objective/subjective: Pt was alert and oriented x 5. Present for walk-in appt due to crisis of obtaining custody of his daughter. Derek Little had a tearful & depressed affect. Visual contact was not made as this was phone assessment only that lasted 45 minutes.  Pt reports that three days ago DSS came and got his daughter out of mother's custody. Pt and mother have been separated for some time now and pt was aware that daughters mother had some legal issues that were pending. There was possibility in initial assessment of 7/29 that pt could obtain custody of child. Derek Little did not think it would be this quick. He does report that he has relapsed from previous detox date of 3 weeks ago, total of 2 to 3 x weekly he reports, but never while daughter was present in the home. Pt did deny detox resources during crisis.  Assessment/Plan: Pt endorses symptoms for anxiety and depression as tension, worry, panic attack, fatigue, sadness and tearfulness. Pt continue to meet criteria for Major Depression and Opioid dependence with opioid-induced mood disorder. Plan moving forward will be to 1. Daughter safety 2. DSS follow up visit at 2pm today 3. Getting daughter to grandmothers' home in [place] 4. Talking with mother about going with daughter. Pt was agreeable to plan and  reported relief.     I discussed the assessment and treatment plan with the patient. The patient was provided an opportunity to ask questions and all were answered. The patient agreed with the plan and demonstrated an understanding of the instructions.   The patient was advised to call back or seek an in-person evaluation if the symptoms worsen or if the condition fails to improve as anticipated.  I provided 45 minutes of non-face-to-face time during this encounter.   Weber Cooks, LCSW   Participation Level: Active  Behavioral Response: CasualAlertDepressed and Dysphoric  Type of Therapy: Individual Therapy  Treatment Goals addressed: Anxiety and Coping  Interventions: CBT, Supportive and Reframing  Summary: Derek Little is a 34 y.o. male who presents with Major depression .   Suicidal/Homicidal: NAwithout intent/plan   Plan: Return again for next  avb session which already was scheduled prior to walk in.  Diagnosis: Axis I: Major Depression, Recurrent severe       Weber Cooks, LCSW 01/15/2020

## 2020-02-13 ENCOUNTER — Ambulatory Visit (INDEPENDENT_AMBULATORY_CARE_PROVIDER_SITE_OTHER): Payer: Medicaid Other | Admitting: Licensed Clinical Social Worker

## 2020-02-13 ENCOUNTER — Other Ambulatory Visit: Payer: Self-pay

## 2020-02-13 DIAGNOSIS — F332 Major depressive disorder, recurrent severe without psychotic features: Secondary | ICD-10-CM | POA: Diagnosis not present
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
## 2020-02-18 ENCOUNTER — Ambulatory Visit (HOSPITAL_COMMUNITY): Payer: Medicaid Other | Admitting: Behavioral Health

## 2020-02-20 ENCOUNTER — Ambulatory Visit (HOSPITAL_COMMUNITY): Payer: Medicaid Other | Admitting: Licensed Clinical Social Worker

## 2020-02-20 ENCOUNTER — Ambulatory Visit (HOSPITAL_COMMUNITY): Payer: Medicaid Other | Admitting: Psychiatry

## 2020-02-27 ENCOUNTER — Ambulatory Visit (HOSPITAL_COMMUNITY): Payer: Medicaid Other | Admitting: Licensed Clinical Social Worker

## 2020-02-27 ENCOUNTER — Other Ambulatory Visit: Payer: Self-pay

## 2020-02-27 ENCOUNTER — Telehealth (HOSPITAL_COMMUNITY): Payer: Self-pay | Admitting: Licensed Clinical Social Worker

## 2020-02-27 NOTE — Telephone Encounter (Signed)
LCSW sent two links via text message to pt mobile phone provided on epic face sheet for pt  1pm appt.  After no show for 10 minutes, LCSW f/u with a PC to pt and left HIPAA compliant VM. Pt canceled last appointment and is a no show for this appointment.

## 2020-03-05 ENCOUNTER — Other Ambulatory Visit: Payer: Self-pay

## 2020-03-05 ENCOUNTER — Ambulatory Visit (HOSPITAL_COMMUNITY): Payer: Medicaid Other | Admitting: Licensed Clinical Social Worker

## 2020-03-05 ENCOUNTER — Telehealth (HOSPITAL_COMMUNITY): Payer: Self-pay | Admitting: Licensed Clinical Social Worker

## 2020-03-05 NOTE — Telephone Encounter (Signed)
LCSW sent two links to phone after pt did not show after two links sent, LCSW f/u with a phone call. Pt did not answer a HIPAA compliant VM was left.

## 2020-03-12 ENCOUNTER — Ambulatory Visit (HOSPITAL_COMMUNITY): Payer: Self-pay | Admitting: Licensed Clinical Social Worker

## 2020-03-19 ENCOUNTER — Ambulatory Visit (HOSPITAL_COMMUNITY): Payer: Self-pay | Admitting: Licensed Clinical Social Worker

## 2020-03-26 ENCOUNTER — Ambulatory Visit (HOSPITAL_COMMUNITY): Payer: Self-pay | Admitting: Licensed Clinical Social Worker

## 2020-04-02 ENCOUNTER — Ambulatory Visit (HOSPITAL_COMMUNITY): Payer: Self-pay | Admitting: Licensed Clinical Social Worker

## 2020-04-09 ENCOUNTER — Ambulatory Visit (HOSPITAL_COMMUNITY): Payer: Self-pay | Admitting: Licensed Clinical Social Worker

## 2021-04-19 ENCOUNTER — Other Ambulatory Visit: Payer: Self-pay

## 2021-04-19 ENCOUNTER — Emergency Department (HOSPITAL_COMMUNITY): Payer: Medicaid Other

## 2021-04-19 ENCOUNTER — Encounter (HOSPITAL_COMMUNITY): Payer: Self-pay

## 2021-04-19 ENCOUNTER — Emergency Department (HOSPITAL_COMMUNITY)
Admission: EM | Admit: 2021-04-19 | Discharge: 2021-04-19 | Disposition: A | Discharge status: Home / Self Care | Payer: Medicaid Other | Attending: Emergency Medicine | Admitting: Emergency Medicine

## 2021-04-19 DIAGNOSIS — F17210 Nicotine dependence, cigarettes, uncomplicated: Secondary | ICD-10-CM | POA: Diagnosis not present

## 2021-04-19 DIAGNOSIS — R072 Precordial pain: Secondary | ICD-10-CM | POA: Diagnosis present

## 2021-04-19 DIAGNOSIS — R202 Paresthesia of skin: Secondary | ICD-10-CM | POA: Diagnosis not present

## 2021-04-19 DIAGNOSIS — I2510 Atherosclerotic heart disease of native coronary artery without angina pectoris: Secondary | ICD-10-CM | POA: Insufficient documentation

## 2021-04-19 DIAGNOSIS — R1013 Epigastric pain: Secondary | ICD-10-CM | POA: Insufficient documentation

## 2021-04-19 DIAGNOSIS — Z951 Presence of aortocoronary bypass graft: Secondary | ICD-10-CM | POA: Diagnosis not present

## 2021-04-19 LAB — CBC
HCT: 43 % (ref 39.0–52.0)
Hemoglobin: 14.7 g/dL (ref 13.0–17.0)
MCH: 29.7 pg (ref 26.0–34.0)
MCHC: 34.2 g/dL (ref 30.0–36.0)
MCV: 86.9 fL (ref 80.0–100.0)
Platelets: 255 10*3/uL (ref 150–400)
RBC: 4.95 MIL/uL (ref 4.22–5.81)
RDW: 12.7 % (ref 11.5–15.5)
WBC: 11.9 10*3/uL — ABNORMAL HIGH (ref 4.0–10.5)
nRBC: 0 % (ref 0.0–0.2)

## 2021-04-19 LAB — BASIC METABOLIC PANEL
Anion gap: 9 (ref 5–15)
BUN: 10 mg/dL (ref 6–20)
CO2: 26 mmol/L (ref 22–32)
Calcium: 9.3 mg/dL (ref 8.9–10.3)
Chloride: 102 mmol/L (ref 98–111)
Creatinine, Ser: 0.79 mg/dL (ref 0.61–1.24)
GFR, Estimated: >60 mL/min (ref >60)
Glucose, Bld: 96 mg/dL (ref 70–99)
Potassium: 3.8 mmol/L (ref 3.5–5.1)
Sodium: 137 mmol/L (ref 135–145)

## 2021-04-19 LAB — TROPONIN I (HIGH SENSITIVITY)
Troponin I (High Sensitivity): 3 ng/L (ref <18)
Troponin I (High Sensitivity): <2 ng/L (ref <18)

## 2021-04-19 NOTE — ED Triage Notes (Signed)
Patient complains of dizziness and bilateral arm numbness and tingling since 1130 today. Has had MI in past and concerned he may be having another. Denies chest pain but complains of mild epigastric pain

## 2021-04-19 NOTE — ED Notes (Signed)
RN reviewed discharge instructions w/ pt. Follow up reviewed, pt had no further questions 

## 2021-04-19 NOTE — Discharge Instructions (Addendum)
Return for any new or worse symptoms.  Make an appointment to follow-up with cardiology.  Make an appointment follow-up with wellness clinic for general care.

## 2021-04-19 NOTE — ED Provider Notes (Addendum)
[facility] EMERGENCY DEPARTMENT Provider Note   CSN: [id number] Arrival date & time: 04/19/21  1318     History No chief complaint on file.   Derek Little is a 35 y.o. male.  Patient with a known history of coronary disease.  Status post CABG in 2019.  Patient presented with a non-STEMI had a percent LAD and 100% RCA.  Patient's for the past several weeks has had that not feeling kind of in his lower sternum area that led to when he had a heart attack before and then today at around 1130 felt as if he was having palpitations dizziness bilateral arm tingling and some numbness.  And has that persistent kind of lower sternal upper epigastric pain.  [place] was in [place] is back in this area.  Needs to get replug back into cardiology.      Past Medical History:  Diagnosis Date   CAD (coronary artery disease)    Substance abuse [facility])     Patient Active Problem List   Diagnosis Date Noted   Severe recurrent major depression without psychotic features ([place]) 02/16/2018   Polysubstance (including opioids) dependence, daily use ([place]) 02/01/2018   Opioid dependence with opioid-induced mood disorder ([place])    Alcohol dependence with alcohol-induced mood disorder ([place])    MDD (major depressive disorder), recurrent severe, without psychosis ([place]) 01/31/2018    Past Surgical History:  Procedure Laterality Date   CARDIAC SURGERY         No family history on file.  Social History   Tobacco Use   Smoking status: Every Day    Types: Cigarettes   Smokeless tobacco: Former    Types: Snuff  Vaping Use   Vaping Use: Never used  Substance Use Topics   Alcohol use: Not Currently   Drug use: Yes    Types: Heroin    Comment: Heroin /Percocet    Home Medications Prior to Admission medications   Medication Sig Start Date End Date Taking? Authorizing Provider  ibuprofen (ADVIL) 200 MG tablet Take 200 mg by mouth every 6 (six) hours as needed for headache,  moderate pain or mild pain.   Yes [provider]  nitroGLYCERIN (NITROSTAT) 0.4 MG SL tablet Place 1 tablet (0.4 mg total) under the tongue every 5 (five) minutes as needed for chest pain. 02/19/18  Yes Money, Lowry Ram, FNP  amoxicillin (AMOXIL) 500 MG capsule Take 1 capsule (500 mg total) by mouth 3 (three) times daily. Patient not taking: No sig reported 06/22/18   Ashley Murrain, NP  aspirin 81 MG chewable tablet Chew 81 mg by mouth daily. Patient not taking: Reported on 04/19/2021    [provider]  atorvastatin (LIPITOR) 10 MG tablet Take 1 tablet (10 mg total) by mouth daily at 6 PM. For high cholesterol Patient not taking: No sig reported 02/19/18   Money, Lowry Ram, FNP  gabapentin (NEURONTIN) 100 MG capsule Take 1 capsule (100 mg total) by mouth 3 (three) times daily. Withdrawal symptoms Patient not taking: No sig reported 02/19/18   Money, Lowry Ram, FNP  hydrOXYzine (ATARAX/VISTARIL) 50 MG tablet Take 1 tablet (50 mg total) by mouth 3 (three) times daily as needed for anxiety. Patient not taking: No sig reported 02/19/18   Money, Lowry Ram, FNP  metoprolol tartrate 37.5 MG TABS Take 37.5 mg by mouth 2 (two) times daily. For high blood pressure Patient not taking: No sig reported 02/19/18   Money, Lowry Ram, FNP  mirtazapine (REMERON)  7.5 MG tablet Take 1 tablet (7.5 mg total) by mouth at bedtime. For sleep and mood control Patient not taking: No sig reported 02/19/18   Money, Feliz Beam B, FNP  nicotine (NICODERM CQ - DOSED IN MG/24 HOURS) 21 mg/24hr patch Place 1 patch (21 mg total) onto the skin daily. Patient not taking: Reported on 04/19/2021 02/03/18   Oneta Rack, NP  traZODone (DESYREL) 100 MG tablet Take 1 tablet (100 mg total) by mouth at bedtime and may repeat dose one time if needed. Patient not taking: No sig reported 02/19/18   Money, Gerlene Burdock, FNP    Allergies    Patient has no known allergies.  Review of Systems   Review of Systems  Constitutional:  Negative for  chills and fever.  HENT:  Negative for ear pain and sore throat.   Eyes:  Negative for pain and visual disturbance.  Respiratory:  Negative for cough and shortness of breath.   Cardiovascular:  Positive for chest pain and palpitations. Negative for leg swelling.  Gastrointestinal:  Negative for abdominal pain and vomiting.  Genitourinary:  Negative for dysuria and hematuria.  Musculoskeletal:  Negative for arthralgias and back pain.  Skin:  Negative for color change and rash.  Neurological:  Positive for numbness. Negative for seizures and syncope.  All other systems reviewed and are negative.  Physical Exam Updated Vital Signs BP 120/83   Pulse 75   Temp 99.7 F (37.6 C) (Oral)   Resp 15   SpO2 97%   Physical Exam Vitals and nursing note reviewed.  Constitutional:      General: He is not in acute distress.    Appearance: Normal appearance. He is well-developed.  HENT:     Head: Normocephalic and atraumatic.  Eyes:     Extraocular Movements: Extraocular movements intact.     Conjunctiva/sclera: Conjunctivae normal.     Pupils: Pupils are equal, round, and reactive to light.  Cardiovascular:     Rate and Rhythm: Normal rate and regular rhythm.     Heart sounds: No murmur heard. Pulmonary:     Effort: Pulmonary effort is normal. No respiratory distress.     Breath sounds: Normal breath sounds. No wheezing or rales.  Abdominal:     Palpations: Abdomen is soft.     Tenderness: There is no abdominal tenderness.  Musculoskeletal:        General: No swelling. Normal range of motion.     Cervical back: Normal range of motion and neck supple.  Skin:    General: Skin is warm and dry.     Capillary Refill: Capillary refill takes less than 2 seconds.  Neurological:     General: No focal deficit present.     Mental Status: He is alert and oriented to person, place, and time.    ED Results / Procedures / Treatments   Labs (all labs ordered are listed, but only abnormal results  are displayed) Labs Reviewed  CBC - Abnormal; Notable for the following components:      Result Value   WBC 11.9 (*)    All other components within normal limits  BASIC METABOLIC PANEL  TROPONIN I (HIGH SENSITIVITY)  TROPONIN I (HIGH SENSITIVITY)    EKG EKG Interpretation  Date/Time:  Monday April 19 2021 17:11:14 EST Ventricular Rate:  77 PR Interval:  175 QRS Duration: 88 QT Interval:  378 QTC Calculation: 428 R Axis:   67 Text Interpretation: Sinus rhythm Confirmed by Vanetta Mulders [phone]) on 04/19/2021  5:37:59 PM  Radiology DG Chest 2 View  Result Date: 04/19/2021 CLINICAL DATA:  Sec substernal chest pain beginning today. EXAM: CHEST - 2 VIEW COMPARISON:  02/18/2018 FINDINGS: Previous median sternotomy. Heart size is normal. The lungs are clear. The vascularity is normal. No effusions. No acute bone finding. IMPRESSION: Previous median sternotomy and CABG.  No active disease. Electronically Signed   By: Nelson Chimes M.D.   On: 04/19/2021 16:11    Procedures Procedures   Medications Ordered in ED Medications - No data to display  ED Course  I have reviewed the triage vital signs and the nursing notes.  Pertinent labs & imaging results that were available during my care of the patient were reviewed by me and considered in my medical decision making (see chart for details).    MDM Rules/Calculators/A&P                           Patient's initial troponin was normal.  CBC significant for mild leukocytosis.  Patient's oxygen level 100% on room air not tachycardic.  No arrhythmias on cardiac monitoring.  Basic metabolic panel is normal.  EKG without any acute findings.  Chest x-ray previous median sternotomy and CABG no active disease.  Delta troponin is pending.  If delta troponin has no significant changes patient stable for discharge home and follow-up with cardiology.  Opponents x2 without any significant abnormalities.  Will have patient follow-up with cardiology  locally.  We will also give wellness clinic for primary care type follow-up.  Patient follow-up with the wellness clinic.  May require further work-up for the low sternal epigastric abdominal pain patient nontoxic no acute distress tonight.  Does not require ultrasound at this point in time.   Final Clinical Impression(s) / ED Diagnoses Final diagnoses:  Precordial pain    Rx / DC Orders ED Discharge Orders     None        Fredia Sorrow, MD 04/19/21 [id number]    Fredia Sorrow, MD 04/19/21 2023

## 2021-04-19 NOTE — ED Notes (Signed)
Pt report tight squeezing ball in epigastric region x 1 month that is worse when stressed. 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 Pt reports w/ previous MI, he didn't have CP, but had the same sensations as he is feeling today. Pt also reporting lump feeling in throat. Rates the "uncomfortableness" 3/10.

## 2021-04-19 NOTE — ED Provider Notes (Signed)
Emergency Medicine Provider Triage Evaluation Note  Derek Little , a 35 y.o. male  was evaluated in triage.  Pt complains of substernal chest pain that began around 1130 today.  Pain radiates down bilateral arms which she describes as a numbness.  Patient has a history of myocardial infarction which he states is identical symptoms.  He denies fever or chills.  Review of Systems  Positive:  Negative: See above   Physical Exam  BP (!) 136/95 (BP Location: Right Arm)   Pulse 93   Temp 99.7 F (37.6 C) (Oral)   Resp 16   SpO2 99%  Gen:   Awake, no distress   Resp:  Normal effort MSK:   Moves extremities without difficulty  Other:    Medical Decision Making  Medically screening exam initiated at 1:43 PM.  Appropriate orders placed.  Derek Little was informed that the remainder of the evaluation will be completed by another provider, this initial triage assessment does not replace that evaluation, and the importance of remaining in the ED until their evaluation is complete.     Honor Loh Pella, PA-C 04/19/21 1344    Mancel Bale, MD 04/19/21 [phone]

## 2021-04-28 ENCOUNTER — Ambulatory Visit: Payer: Medicaid Other | Admitting: Cardiology

## 2021-05-24 ENCOUNTER — Inpatient Hospital Stay: Payer: Medicaid Other | Admitting: Nurse Practitioner

## 2021-05-25 NOTE — Progress Notes (Deleted)
Cardiology Office Note   Date:  05/27/2021   ID:  Derek Little, DOB 1986/01/17, MRN [id number]  PCP:  Patient, No Pcp Per (Inactive)  Cardiologist:   Rollene Rotunda, MD Referring:  ***  No chief complaint on file.     History of Present Illness: Derek Little is a 35 y.o. male who presents for ***     Past Medical History:  Diagnosis Date   CAD (coronary artery disease)    Substance abuse ([facility])     Past Surgical History:  Procedure Laterality Date   CARDIAC SURGERY       Current Outpatient Medications  Medication Sig Dispense Refill   amoxicillin (AMOXIL) 500 MG capsule Take 1 capsule (500 mg total) by mouth 3 (three) times daily. (Patient not taking: No sig reported) 30 capsule 0   aspirin 81 MG chewable tablet Chew 81 mg by mouth daily. (Patient not taking: Reported on 04/19/2021)     atorvastatin (LIPITOR) 10 MG tablet Take 1 tablet (10 mg total) by mouth daily at 6 PM. For high cholesterol (Patient not taking: No sig reported) 30 tablet 0   gabapentin (NEURONTIN) 100 MG capsule Take 1 capsule (100 mg total) by mouth 3 (three) times daily. Withdrawal symptoms (Patient not taking: No sig reported) 90 capsule 0   hydrOXYzine (ATARAX/VISTARIL) 50 MG tablet Take 1 tablet (50 mg total) by mouth 3 (three) times daily as needed for anxiety. (Patient not taking: No sig reported) 30 tablet 0   ibuprofen (ADVIL) 200 MG tablet Take 200 mg by mouth every 6 (six) hours as needed for headache, moderate pain or mild pain.     metoprolol tartrate 37.5 MG TABS Take 37.5 mg by mouth 2 (two) times daily. For high blood pressure (Patient not taking: No sig reported) 30 tablet 0   mirtazapine (REMERON) 7.5 MG tablet Take 1 tablet (7.5 mg total) by mouth at bedtime. For sleep and mood control (Patient not taking: No sig reported) 30 tablet 0   nicotine (NICODERM CQ - DOSED IN MG/24 HOURS) 21 mg/24hr patch Place 1 patch (21 mg total) onto the skin daily. (Patient not taking: Reported  on 04/19/2021) 28 patch 0   nitroGLYCERIN (NITROSTAT) 0.4 MG SL tablet Place 1 tablet (0.4 mg total) under the tongue every 5 (five) minutes as needed for chest pain. 10 tablet 1   traZODone (DESYREL) 100 MG tablet Take 1 tablet (100 mg total) by mouth at bedtime and may repeat dose one time if needed. (Patient not taking: No sig reported) 30 tablet 0   No current facility-administered medications for this visit.    Allergies:   Patient has no known allergies.    Social History:  The patient  reports that he has been smoking cigarettes. He has quit using smokeless tobacco.  His smokeless tobacco use included snuff. He reports that he does not currently use alcohol. He reports current drug use. Drug: Heroin.   Family History:  The patient's ***family history is not on file.    ROS:  Please see the history of present illness.   Otherwise, review of systems are positive for {NONE DEFAULTED:18576}.   All other systems are reviewed and negative.    PHYSICAL EXAM: VS:  There were no vitals taken for this visit. , BMI There is no height or weight on file to calculate BMI. GENERAL:  Well appearing HEENT:  Pupils equal round and reactive, fundi not visualized, oral mucosa unremarkable NECK:  No jugular  venous distention, waveform within normal limits, carotid upstroke brisk and symmetric, no bruits, no thyromegaly LYMPHATICS:  No cervical, inguinal adenopathy LUNGS:  Clear to auscultation bilaterally BACK:  No CVA tenderness CHEST:  Unremarkable HEART:  PMI not displaced or sustained,S1 and S2 within normal limits, no S3, no S4, no clicks, no rubs, *** murmurs ABD:  Flat, positive bowel sounds normal in frequency in pitch, no bruits, no rebound, no guarding, no midline pulsatile mass, no hepatomegaly, no splenomegaly EXT:  2 plus pulses throughout, no edema, no cyanosis no clubbing SKIN:  No rashes no nodules NEURO:  Cranial nerves II through XII grossly intact, motor grossly intact  throughout PSYCH:  Cognitively intact, oriented to person place and time    EKG:  EKG {ACTION; IS/IS [id number]} ordered today. The ekg ordered today demonstrates ***   Recent Labs: 04/19/2021: BUN 10; Creatinine, Ser 0.79; Hemoglobin 14.7; Platelets 255; Potassium 3.8; Sodium 137    Lipid Panel No results found for: CHOL, TRIG, HDL, CHOLHDL, VLDL, LDLCALC, LDLDIRECT    Wt Readings from Last 3 Encounters:  08/13/18 204 lb 7 oz (92.7 kg)  03/02/16 202 lb 11.2 oz (91.9 kg)      Other studies Reviewed: Additional studies/ records that were reviewed today include: ***. Review of the above records demonstrates:  Please see elsewhere in the note.  ***   ASSESSMENT AND PLAN:  ***   Current medicines are reviewed at length with the patient today.  The patient {ACTIONS; HAS/DOES NOT HAVE:19233} concerns regarding medicines.  The following changes have been made:  {PLAN; NO CHANGE:13088:s}  Labs/ tests ordered today include: *** No orders of the defined types were placed in this encounter.    Disposition:   FU with ***    Signed, Rollene Rotunda, MD  05/27/2021 2:57 PM    [facility]

## 2021-05-27 ENCOUNTER — Ambulatory Visit: Payer: Medicaid Other | Admitting: Cardiology

## 2021-10-05 ENCOUNTER — Emergency Department (HOSPITAL_COMMUNITY)
Admission: EM | Admit: 2021-10-05 | Discharge: 2021-10-05 | Disposition: A | Discharge status: Home / Self Care | Payer: Medicaid Other | Attending: Emergency Medicine | Admitting: Emergency Medicine

## 2021-10-05 ENCOUNTER — Emergency Department (HOSPITAL_COMMUNITY): Payer: Medicaid Other

## 2021-10-05 ENCOUNTER — Other Ambulatory Visit: Payer: Self-pay

## 2021-10-05 DIAGNOSIS — Z7982 Long term (current) use of aspirin: Secondary | ICD-10-CM | POA: Diagnosis not present

## 2021-10-05 DIAGNOSIS — S91132A Puncture wound without foreign body of left great toe without damage to nail, initial encounter: Secondary | ICD-10-CM | POA: Diagnosis not present

## 2021-10-05 DIAGNOSIS — Y990 Civilian activity done for income or pay: Secondary | ICD-10-CM | POA: Diagnosis not present

## 2021-10-05 DIAGNOSIS — Z23 Encounter for immunization: Secondary | ICD-10-CM | POA: Insufficient documentation

## 2021-10-05 DIAGNOSIS — W450XXA Nail entering through skin, initial encounter: Secondary | ICD-10-CM | POA: Diagnosis not present

## 2021-10-05 DIAGNOSIS — S99922A Unspecified injury of left foot, initial encounter: Secondary | ICD-10-CM | POA: Diagnosis present

## 2021-10-05 DIAGNOSIS — S91133D Puncture wound without foreign body of unspecified great toe without damage to nail, subsequent encounter: Secondary | ICD-10-CM

## 2021-10-05 MED ORDER — TETANUS-DIPHTH-ACELL PERTUSSIS 5-2.5-18.5 LF-MCG/0.5 IM SUSY
0.5000 mL | PREFILLED_SYRINGE | Freq: Once | INTRAMUSCULAR | Status: AC
  Administered 2021-10-05: 0.5 mL via INTRAMUSCULAR
  Filled 2021-10-05: qty 0.5

## 2021-10-05 MED ORDER — CIPROFLOXACIN HCL 250 MG PO TABS: 250.0000 mg | ORAL_TABLET | Freq: Two times a day (BID) | ORAL | 0 refills | Status: DC

## 2021-10-05 NOTE — ED Triage Notes (Signed)
Pt. Stated, I stepped on nail left foot. 45 minutes ago ?

## 2021-10-05 NOTE — ED Provider Notes (Signed)
?[facility] EMERGENCY DEPARTMENT ?Provider Note ? ? ?CSN: [id number] ?Arrival date & time: 10/05/21  1238 ? ?  ? ?History ? ?Chief Complaint  ?Patient presents with  ? stepped on nail  ? ? ?Derek Little is a 36 y.o. male with no relevant medical history.  The patient presents to ED for evaluation of puncture wound to left great toe.  Patient states he was at work when a nail went through his shoe into his left great toe.  The patient denies having to pull the nail out of his toe.  Patient unsure of last Tdap.  Patient states he presented to ED for evaluation at this time.  Patient denies all review of systems. ? ?HPI ? ?  ? ?Home Medications ?Prior to Admission medications   ?Medication Sig Start Date End Date Taking? Authorizing Provider  ?ciprofloxacin (CIPRO) 250 MG tablet Take 1 tablet (250 mg total) by mouth every 12 (twelve) hours. 10/05/21  Yes Al Decant, PA-C  ?amoxicillin (AMOXIL) 500 MG capsule Take 1 capsule (500 mg total) by mouth 3 (three) times daily. ?Patient not taking: No sig reported 06/22/18   Janne Napoleon, NP  ?aspirin 81 MG chewable tablet Chew 81 mg by mouth daily. ?Patient not taking: Reported on 04/19/2021    [provider]  ?atorvastatin (LIPITOR) 10 MG tablet Take 1 tablet (10 mg total) by mouth daily at 6 PM. For high cholesterol ?Patient not taking: No sig reported 02/19/18   Money, Gerlene Burdock, FNP  ?gabapentin (NEURONTIN) 100 MG capsule Take 1 capsule (100 mg total) by mouth 3 (three) times daily. Withdrawal symptoms ?Patient not taking: No sig reported 02/19/18   Money, Gerlene Burdock, FNP  ?hydrOXYzine (ATARAX/VISTARIL) 50 MG tablet Take 1 tablet (50 mg total) by mouth 3 (three) times daily as needed for anxiety. ?Patient not taking: No sig reported 02/19/18   Money, Gerlene Burdock, FNP  ?ibuprofen (ADVIL) 200 MG tablet Take 200 mg by mouth every 6 (six) hours as needed for headache, moderate pain or mild pain.    [provider]  ?metoprolol tartrate 37.5 MG  TABS Take 37.5 mg by mouth 2 (two) times daily. For high blood pressure ?Patient not taking: No sig reported 02/19/18   Money, Gerlene Burdock, FNP  ?mirtazapine (REMERON) 7.5 MG tablet Take 1 tablet (7.5 mg total) by mouth at bedtime. For sleep and mood control ?Patient not taking: No sig reported 02/19/18   Money, Gerlene Burdock, FNP  ?nicotine (NICODERM CQ - DOSED IN MG/24 HOURS) 21 mg/24hr patch Place 1 patch (21 mg total) onto the skin daily. ?Patient not taking: Reported on 04/19/2021 02/03/18   Oneta Rack, NP  ?nitroGLYCERIN (NITROSTAT) 0.4 MG SL tablet Place 1 tablet (0.4 mg total) under the tongue every 5 (five) minutes as needed for chest pain. 02/19/18   Money, Gerlene Burdock, FNP  ?traZODone (DESYREL) 100 MG tablet Take 1 tablet (100 mg total) by mouth at bedtime and may repeat dose one time if needed. ?Patient not taking: No sig reported 02/19/18   Money, Gerlene Burdock, FNP  ?   ? ?Allergies    ?Patient has no known allergies.   ? ?Review of Systems   ?Review of Systems  ?Skin:  Positive for wound.  ?All other systems reviewed and are negative. ? ?Physical Exam ?Updated Vital Signs ?BP 110/80   Pulse 82   Temp 98.5 ?F (36.9 ?C)   Resp 18   SpO2 99%  ?Physical Exam ?Vitals and  nursing note reviewed.  ?Constitutional:   ?   General: He is not in acute distress. ?   Appearance: Normal appearance. He is not ill-appearing, toxic-appearing or diaphoretic.  ?HENT:  ?   Head: Normocephalic and atraumatic.  ?   Nose: Nose normal. No congestion.  ?   Mouth/Throat:  ?   Mouth: Mucous membranes are moist.  ?   Pharynx: Oropharynx is clear.  ?Eyes:  ?   Extraocular Movements: Extraocular movements intact.  ?   Conjunctiva/sclera: Conjunctivae normal.  ?   Pupils: Pupils are equal, round, and reactive to light.  ?Cardiovascular:  ?   Rate and Rhythm: Normal rate and regular rhythm.  ?Pulmonary:  ?   Effort: Pulmonary effort is normal.  ?   Breath sounds: Normal breath sounds. No wheezing.  ?Abdominal:  ?   General: Abdomen is flat. Bowel  sounds are normal.  ?   Palpations: Abdomen is soft.  ?   Tenderness: There is no abdominal tenderness.  ?Musculoskeletal:     ?   General: Normal range of motion.  ?   Cervical back: Normal range of motion and neck supple. No tenderness.  ?Skin: ?   General: Skin is warm and dry.  ?   Capillary Refill: Capillary refill takes less than 2 seconds.  ?   Findings: Signs of injury present.  ?   Comments: Patient has superficial wound to volar side of left great toe.  No bleeding.  No drainage.  Slight skin break however no laceration.  Puncture wound does not appear to to be deep.  Injury appears to be superficial.  ?Neurological:  ?   Mental Status: He is alert and oriented to person, place, and time.  ? ? ?ED Results / Procedures / Treatments   ?Labs ?(all labs ordered are listed, but only abnormal results are displayed) ?Labs Reviewed - No data to display ? ?EKG ?None ? ?Radiology ?DG Toe Great Left ? ?Result Date: 10/05/2021 ?CLINICAL DATA:  Left great toe pain after stepping on nail today. EXAM: LEFT GREAT TOE COMPARISON:  None. FINDINGS: There is no evidence of fracture or dislocation. There is no evidence of arthropathy or other focal bone abnormality. Soft tissues are unremarkable. No radiopaque foreign body is noted. IMPRESSION: Negative. Electronically Signed   By: Lupita Raider M.D.   On: 10/05/2021 13:45   ? ?Procedures ?Procedures  ? ? ?Medications Ordered in ED ?Medications  ?Tdap (BOOSTRIX) injection 0.5 mL (0.5 mLs Intramuscular Given 10/05/21 1316)  ? ? ?ED Course/ Medical Decision Making/ A&P ?  ?                        ?Medical Decision Making ?Amount and/or Complexity of Data Reviewed ?Radiology: ordered. ? ?Risk ?Prescription drug management. ? ? ?36 year old male presents ED for evaluation of puncture wound.  Please see HPI for further details. ? ?Plain film imaging of patient left great toe shows no radio opaque foreign bodies.  There is no fracture, dislocation.  There is no soft tissue  swelling. ? ?Patient tetanus updated.  Patient placed on 5 days of ciprofloxacin.  Patient had all his questions answered to his satisfaction.  Patient given return precautions.  Patient stable for discharge. ? ? ?Final Clinical Impression(s) / ED Diagnoses ?Final diagnoses:  ?Puncture wound of great toe w/o foreign body w/o damage to nail, unspecified laterality, subsequent encounter  ? ? ?Rx / DC Orders ?ED Discharge Orders   ? ?  Ordered  ?  ciprofloxacin (CIPRO) 250 MG tablet  Every 12 hours       ? 10/05/21 1422  ? ?  ?  ? ?  ? ? ?  ?Al DecantGroce, Eleanna Theilen F, PA-C ?10/05/21 1500 ? ?  ?Jacalyn LefevreHaviland, Julie, MD ?10/08/21 [phone] ? ?

## 2021-10-05 NOTE — Discharge Instructions (Addendum)
Return to ED with any new symptoms such as fevers, nausea or vomiting, body aches or chills ?Please monitor the area in question over the next few days.  Signs of infection include redness, swelling, drainage of pus ?Please pick up probiotic while taking antibiotic I placed you on. ?Please read attached informational guide concerning puncture wounds ?

## 2021-10-05 NOTE — ED Provider Triage Note (Signed)
Emergency Medicine Provider Triage Evaluation Note ? ?Derek Little , a 36 y.o. male  was evaluated in triage.  Pt complains of stepping on a nail at work.  Patient states that the nail did not impaled his toe, it did go through his shoe however. ? ?Review of Systems  ?Positive:  ?Negative:  ? ?Physical Exam  ?BP 106/87 (BP Location: Right Arm)   Pulse 84   Temp 98.6 ?F (37 ?C) (Oral)   Resp 17   SpO2 99%  ?Gen:   Awake, no distress   ?Resp:  Normal effort  ?MSK:   Moves extremities without difficulty  ?Other:  Abrasion noted to underside of great toe on left side.  No laceration.  Patient neurovascularly intact. ? ?Medical Decision Making  ?Medically screening exam initiated at 1:12 PM.  Appropriate orders placed.  Derek Little was informed that the remainder of the evaluation will be completed by another provider, this initial triage assessment does not replace that evaluation, and the importance of remaining in the ED until their evaluation is complete. ? ? ?  ?Al Decant, PA-C ?10/05/21 1313 ? ?

## 2022-08-18 ENCOUNTER — Other Ambulatory Visit (HOSPITAL_COMMUNITY): Payer: Self-pay | Admitting: Family Medicine

## 2022-08-18 DIAGNOSIS — R0789 Other chest pain: Secondary | ICD-10-CM

## 2022-09-22 ENCOUNTER — Encounter (HOSPITAL_COMMUNITY): Payer: Self-pay

## 2022-10-20 ENCOUNTER — Encounter (HOSPITAL_COMMUNITY): Payer: Self-pay

## 2023-05-03 ENCOUNTER — Other Ambulatory Visit: Payer: Self-pay

## 2023-05-03 ENCOUNTER — Encounter (HOSPITAL_COMMUNITY): Payer: Self-pay

## 2023-05-03 ENCOUNTER — Emergency Department (HOSPITAL_COMMUNITY)
Admission: EM | Admit: 2023-05-03 | Discharge: 2023-05-03 | Disposition: A | Discharge status: Home / Self Care | Attending: Emergency Medicine | Admitting: Emergency Medicine

## 2023-05-03 ENCOUNTER — Emergency Department (HOSPITAL_COMMUNITY)

## 2023-05-03 DIAGNOSIS — R079 Chest pain, unspecified: Secondary | ICD-10-CM | POA: Insufficient documentation

## 2023-05-03 DIAGNOSIS — F419 Anxiety disorder, unspecified: Secondary | ICD-10-CM | POA: Diagnosis not present

## 2023-05-03 HISTORY — DX: Presence of aortocoronary bypass graft: Z95.1

## 2023-05-03 LAB — TROPONIN I (HIGH SENSITIVITY)
Troponin I (High Sensitivity): 7 ng/L (ref <18)
Troponin I (High Sensitivity): 5 ng/L (ref <18)

## 2023-05-03 LAB — CBC
HCT: 42 % (ref 39.0–52.0)
Hemoglobin: 14.4 g/dL (ref 13.0–17.0)
MCH: 30.3 pg (ref 26.0–34.0)
MCHC: 34.3 g/dL (ref 30.0–36.0)
MCV: 88.2 fL (ref 80.0–100.0)
Platelets: 254 10*3/uL (ref 150–400)
RBC: 4.76 MIL/uL (ref 4.22–5.81)
RDW: 11.9 % (ref 11.5–15.5)
WBC: 10 10*3/uL (ref 4.0–10.5)
nRBC: 0 % (ref 0.0–0.2)

## 2023-05-03 LAB — BASIC METABOLIC PANEL
Anion gap: 10 (ref 5–15)
BUN: 6 mg/dL (ref 6–20)
CO2: 24 mmol/L (ref 22–32)
Calcium: 8.9 mg/dL (ref 8.9–10.3)
Chloride: 105 mmol/L (ref 98–111)
Creatinine, Ser: 0.69 mg/dL (ref 0.61–1.24)
GFR, Estimated: >60 mL/min (ref >60)
Glucose, Bld: 103 mg/dL — ABNORMAL HIGH (ref 70–99)
Potassium: 3.2 mmol/L — ABNORMAL LOW (ref 3.5–5.1)
Sodium: 139 mmol/L (ref 135–145)

## 2023-05-03 MED ORDER — ACETAMINOPHEN 500 MG PO TABS
1000.0000 mg | ORAL_TABLET | Freq: Once | ORAL | Status: AC
  Administered 2023-05-03: 1000 mg via ORAL
  Filled 2023-05-03: qty 2

## 2023-05-03 MED ORDER — POTASSIUM CHLORIDE CRYS ER 20 MEQ PO TBCR
20.0000 meq | EXTENDED_RELEASE_TABLET | Freq: Once | ORAL | Status: AC
  Administered 2023-05-03: 20 meq via ORAL
  Filled 2023-05-03: qty 1

## 2023-05-03 MED ORDER — METHADONE HCL 10 MG PO TABS
65.0000 mg | ORAL_TABLET | Freq: Once | ORAL | Status: AC
  Administered 2023-05-03: 65 mg via ORAL
  Filled 2023-05-03: qty 7

## 2023-05-03 NOTE — ED Triage Notes (Signed)
Mid chest pain that started last night with dizziness and nausea. Hx of CABG x 2. EMS gave 324mg  Aspirin and 2 nitroglycerin SL with no relief. Alert and oriented x 4.

## 2023-05-03 NOTE — ED Provider Notes (Signed)
[place] EMERGENCY DEPARTMENT AT [facility] Provider Note  CSN: [id number] Arrival date & time: 05/03/23 4098  Chief Complaint(s) Chest Pain  HPI Derek Little is a 37 y.o. male here today for chest pain.  Patient has history of opioid dependence, takes methadone daily.  Currently in police custody, reportedly has a court date tomorrow and the patient is very nervous about this.  2 days ago patient began having chest pain.  He has a past history of a prior MI.   Past Medical History Past Medical History:  Diagnosis Date   CAD (coronary artery disease)    Hx of CABG    Substance abuse (HCC)    Patient Active Problem List   Diagnosis Date Noted   Severe recurrent major depression without psychotic features (HCC) 02/16/2018   Polysubstance (including opioids) dependence, daily use (HCC) 02/01/2018   Opioid dependence with opioid-induced mood disorder (HCC)    Alcohol dependence with alcohol-induced mood disorder (HCC)    MDD (major depressive disorder), recurrent severe, without psychosis (HCC) 01/31/2018   Home Medication(s) Prior to Admission medications   Medication Sig Start Date End Date Taking? Authorizing Provider  acetaminophen (TYLENOL) 325 MG tablet Take 650 mg by mouth 3 (three) times daily as needed for moderate pain (pain score 4-6).   Yes [provider]  alum & mag hydroxide-simeth (MAALOX/MYLANTA) 200-200-20 MG/5ML suspension Take 30 mLs by mouth once.   Yes [provider]  atorvastatin (LIPITOR) 20 MG tablet Take 20 mg by mouth daily.   Yes [provider]  buPROPion (WELLBUTRIN XL) 300 MG 24 hr tablet Take 300 mg by mouth daily. 12/28/22  Yes [provider]  hydrOXYzine (ATARAX) 25 MG tablet Take 25 mg by mouth once.   Yes [provider]  loperamide (IMODIUM A-D) 2 MG tablet Take 2 mg by mouth 3 (three) times daily as needed for diarrhea or loose stools.   Yes [provider]  meclizine  (ANTIVERT) 25 MG tablet Take 25 mg by mouth 3 (three) times daily as needed for dizziness.   Yes [provider]                                                                                                                                    Past Surgical History Past Surgical History:  Procedure Laterality Date   CARDIAC SURGERY     Family History History reviewed. No pertinent family history.  Social History Social History   Tobacco Use   Smoking status: Every Day    Types: Cigarettes   Smokeless tobacco: Former    Types: Snuff  Vaping Use   Vaping status: Never Used  Substance Use Topics   Alcohol use: Not Currently   Drug use: Yes    Types: Heroin    Comment: Heroin /Percocet/xanax   Allergies Patient has no known allergies.  Review of Systems Review of Systems  Physical Exam Vital Signs  I have reviewed the triage vital signs BP 127/76   Pulse 66   Temp 98.1 F (36.7 C) (Oral)   Resp 13   Ht 5\' 8"  (1.727 m)   Wt 70.3 kg   SpO2 99%   BMI 23.57 kg/m   Physical Exam Vitals reviewed.  Cardiovascular:     Rate and Rhythm: Normal rate and regular rhythm.     Heart sounds: Normal heart sounds.  Pulmonary:     Effort: Pulmonary effort is normal.  Neurological:     Mental Status: He is alert.  Psychiatric:        Mood and Affect: Mood is anxious.     ED Results and Treatments Labs (all labs ordered are listed, but only abnormal results are displayed) Labs Reviewed  BASIC METABOLIC PANEL - Abnormal; Notable for the following components:      Result Value   Potassium 3.2 (*)    Glucose, Bld 103 (*)    All other components within normal limits  CBC  TROPONIN I (HIGH SENSITIVITY)  TROPONIN I (HIGH SENSITIVITY)                                                                                                                          Radiology DG Chest Port 1 View  Result Date: 05/03/2023 CLINICAL DATA:  Mid chest pain.  Dizziness.  Nausea.  EXAM: PORTABLE CHEST 1 VIEW COMPARISON:  04/19/2021. FINDINGS: Bilateral lung fields are clear. Bilateral costophrenic angles are clear. Normal cardio-mediastinal silhouette. There are surgical staples along the heart border and sternotomy wires, status post CABG (coronary artery bypass graft). No acute osseous abnormalities. The soft tissues are within normal limits. IMPRESSION: *No active disease. Electronically Signed   By: Jules Schick M.D.   On: 05/03/2023 10:21    Pertinent labs & imaging results that were available during my care of the patient were reviewed by me and considered in my medical decision making (see MDM for details).  Medications Ordered in ED Medications  methadone (DOLOPHINE) tablet 65 mg (65 mg Oral Given 05/03/23 1020)  acetaminophen (TYLENOL) tablet 1,000 mg (1,000 mg Oral Given 05/03/23 1126)  potassium chloride SA (KLOR-CON M) CR tablet 20 mEq (20 mEq Oral Given 05/03/23 1225)  Procedures Procedures  (including critical care time)  Medical Decision Making / ED Course   This patient presents to the ED for concern of chest pain, this involves an extensive number of treatment options, and is a complaint that carries with it a high risk of complications and morbidity.  The differential diagnosis includes ACS, anxiety, less likely PE, less likely pneumothorax, less likely dissection.  MDM: With the patient's prior history of MI, is at higher risk for ACS.  Patient will require at least a delta troponin.  His initial EKG is nonischemic.  I do think that there could be a component of anxiety contributing to the patient's symptoms, understandable given that he has an upcoming court date and is currently in police custody.  I confirm the patient's methadone dose with nursing staff at [facility] in [place].  Have ordered this for the  patient.  Reassessment 1 PM-patient's delta troponin flat.  Not currently experiencing chest pain.  Will discharge back to jail.   Additional history obtained: -Additional history obtained from prison nurse -External records from outside source obtained and reviewed including: Chart review including previous notes, labs, imaging, consultation notes   Lab Tests: -I ordered, reviewed, and interpreted labs.   The pertinent results include:   Labs Reviewed  BASIC METABOLIC PANEL - Abnormal; Notable for the following components:      Result Value   Potassium 3.2 (*)    Glucose, Bld 103 (*)    All other components within normal limits  CBC  TROPONIN I (HIGH SENSITIVITY)  TROPONIN I (HIGH SENSITIVITY)      EKG my independent review the patient's EKG shows no ST segment depressions or elevations, no T wave versions, no evidence of acute ischemia.  EKG Interpretation Date/Time:  Wednesday May 03 2023 09:03:48 EST Ventricular Rate:  84 PR Interval:  166 QRS Duration:  86 QT Interval:  395 QTC Calculation: 467 R Axis:   81  Text Interpretation: Sinus rhythm Left atrial enlargement Confirmed by Anders Simmonds [phone]) on 05/03/2023 1:03:37 PM         Imaging Studies ordered: I ordered imaging studies including chest x-ray I independently visualized and interpreted imaging. I agree with the radiologist interpretation   Medicines ordered and prescription drug management: Meds ordered this encounter  Medications   methadone (DOLOPHINE) tablet 65 mg    Confirmed with [facility]   acetaminophen (TYLENOL) tablet 1,000 mg   potassium chloride SA (KLOR-CON M) CR tablet 20 mEq    -I have reviewed the patients home medicines and have made adjustments as needed   Cardiac Monitoring: The patient was maintained on a cardiac monitor.  I personally viewed and interpreted the cardiac monitored which showed an underlying rhythm of: Normal sinus rhythm  Social Determinants  of Health:  Factors impacting patients care include: Polysubstance use, incarceration   Reevaluation: After the interventions noted above, I reevaluated the patient and found that they have :improved  Co morbidities that complicate the patient evaluation  Past Medical History:  Diagnosis Date   CAD (coronary artery disease)    Hx of CABG    Substance abuse (HCC)       Dispostion: I considered admission for this patient, however patient is chest pain-free, his EKG and troponin are normal.  Will discharge.     Final Clinical Impression(s) / ED Diagnoses Final diagnoses:  Chest pain, unspecified type     @PCDICTATION @    Anders Simmonds T, DO 05/03/23 1305

## 2023-05-03 NOTE — ED Notes (Signed)
Patient verbalizes understanding of discharge instructions. Opportunity for questioning and answers were provided. Pt discharged from ED. 

## 2023-05-03 NOTE — Discharge Instructions (Signed)
Your EKG, and your 2 troponin test today were negative.  You did receive your methadone here in the emergency department.  We will arrange for outpatient cardiology follow-up for you.  Return to the emergency department if you develop new or worsening chest pain.

## 2023-05-08 ENCOUNTER — Encounter (HOSPITAL_COMMUNITY): Payer: Self-pay

## 2023-05-08 ENCOUNTER — Emergency Department (HOSPITAL_COMMUNITY)
Admission: EM | Admit: 2023-05-08 | Discharge: 2023-05-08 | Disposition: A | Discharge status: Home / Self Care | Payer: MEDICAID | Attending: Emergency Medicine | Admitting: Emergency Medicine

## 2023-05-08 DIAGNOSIS — L03314 Cellulitis of groin: Secondary | ICD-10-CM | POA: Insufficient documentation

## 2023-05-08 DIAGNOSIS — D72829 Elevated white blood cell count, unspecified: Secondary | ICD-10-CM | POA: Diagnosis not present

## 2023-05-08 DIAGNOSIS — Z951 Presence of aortocoronary bypass graft: Secondary | ICD-10-CM | POA: Insufficient documentation

## 2023-05-08 DIAGNOSIS — I2510 Atherosclerotic heart disease of native coronary artery without angina pectoris: Secondary | ICD-10-CM | POA: Insufficient documentation

## 2023-05-08 LAB — COMPREHENSIVE METABOLIC PANEL
ALT: 13 U/L (ref 0–44)
AST: 17 U/L (ref 15–41)
Albumin: 4.6 g/dL (ref 3.5–5.0)
Alkaline Phosphatase: 48 U/L (ref 38–126)
Anion gap: 9 (ref 5–15)
BUN: 13 mg/dL (ref 6–20)
CO2: 28 mmol/L (ref 22–32)
Calcium: 9.2 mg/dL (ref 8.9–10.3)
Chloride: 102 mmol/L (ref 98–111)
Creatinine, Ser: 1.02 mg/dL (ref 0.61–1.24)
GFR, Estimated: >60 mL/min (ref >60)
Glucose, Bld: 97 mg/dL (ref 70–99)
Potassium: 3.5 mmol/L (ref 3.5–5.1)
Sodium: 139 mmol/L (ref 135–145)
Total Bilirubin: 0.6 mg/dL (ref <1.2)
Total Protein: 7.9 g/dL (ref 6.5–8.1)

## 2023-05-08 LAB — CBC WITH DIFFERENTIAL/PLATELET
Abs Immature Granulocytes: 0.04 10*3/uL (ref 0.00–0.07)
Basophils Absolute: 0.1 10*3/uL (ref 0.0–0.1)
Basophils Relative: 0 %
Eosinophils Absolute: 0.2 10*3/uL (ref 0.0–0.5)
Eosinophils Relative: 1 %
HCT: 42.4 % (ref 39.0–52.0)
Hemoglobin: 14.4 g/dL (ref 13.0–17.0)
Immature Granulocytes: 0 %
Lymphocytes Relative: 24 %
Lymphs Abs: 2.9 10*3/uL (ref 0.7–4.0)
MCH: 30.4 pg (ref 26.0–34.0)
MCHC: 34 g/dL (ref 30.0–36.0)
MCV: 89.6 fL (ref 80.0–100.0)
Monocytes Absolute: 1.1 10*3/uL — ABNORMAL HIGH (ref 0.1–1.0)
Monocytes Relative: 9 %
Neutro Abs: 8.1 10*3/uL — ABNORMAL HIGH (ref 1.7–7.7)
Neutrophils Relative %: 66 %
Platelets: 231 10*3/uL (ref 150–400)
RBC: 4.73 MIL/uL (ref 4.22–5.81)
RDW: 11.9 % (ref 11.5–15.5)
WBC: 12.3 10*3/uL — ABNORMAL HIGH (ref 4.0–10.5)
nRBC: 0 % (ref 0.0–0.2)

## 2023-05-08 MED ORDER — AMOXICILLIN-POT CLAVULANATE 875-125 MG PO TABS
1.0000 | ORAL_TABLET | Freq: Once | ORAL | Status: AC
  Administered 2023-05-08: 1 via ORAL
  Filled 2023-05-08: qty 1

## 2023-05-08 MED ORDER — AMOXICILLIN-POT CLAVULANATE 875-125 MG PO TABS: 1.0000 | ORAL_TABLET | Freq: Two times a day (BID) | ORAL | 0 refills | Status: AC

## 2023-05-08 NOTE — ED Provider Notes (Cosign Needed Addendum)
[place] EMERGENCY DEPARTMENT AT [facility] Provider Note   CSN: [id number] Arrival date & time: 05/08/23  1454     History  Chief Complaint  Patient presents with   Abscess   Dental Pain    Derek Little is a 37 y.o. male with polysubstance use disorder, CAD, CABG, presents with concern for an abscess to his groin area.  He started noticing it 3 days ago, and it has grown in size.  Denies any previous abscesses to this area.  He denies any penile discharge.  States he is not sexually active.  Denies any hematuria, dysuria, increased frequency, or abdominal pain.  Also reports some pain to the right side of his jaw due to poor dentition.  Denies any fever or chills at home, no recent dental work.    Abscess Associated symptoms: no fatigue and no fever   Dental Pain Associated symptoms: no fever        Home Medications Prior to Admission medications   Medication Sig Start Date End Date Taking? Authorizing Provider  amoxicillin-clavulanate (AUGMENTIN) 875-125 MG tablet Take 1 tablet by mouth every 12 (twelve) hours for 7 days. 05/08/23 05/15/23 Yes Arabella Merles, PA-C  acetaminophen (TYLENOL) 325 MG tablet Take 650 mg by mouth 3 (three) times daily as needed for moderate pain (pain score 4-6).    [provider]  alum & mag hydroxide-simeth (MAALOX/MYLANTA) 200-200-20 MG/5ML suspension Take 30 mLs by mouth once.    [provider]  atorvastatin (LIPITOR) 20 MG tablet Take 20 mg by mouth daily.    [provider]  buPROPion (WELLBUTRIN XL) 300 MG 24 hr tablet Take 300 mg by mouth daily. 12/28/22   [provider]  hydrOXYzine (ATARAX) 25 MG tablet Take 25 mg by mouth once.    [provider]  loperamide (IMODIUM A-D) 2 MG tablet Take 2 mg by mouth 3 (three) times daily as needed for diarrhea or loose stools.    [provider]  meclizine (ANTIVERT) 25 MG tablet Take 25 mg by mouth 3 (three) times daily as  needed for dizziness.    [provider]      Allergies    Patient has no known allergies.    Review of Systems   Review of Systems  Constitutional:  Negative for fatigue and fever.    Physical Exam Updated Vital Signs BP (!) 135/92 (BP Location: Left Arm)   Pulse 96   Temp 98.4 F (36.9 C) (Oral)   Resp 18   SpO2 98%  Physical Exam Vitals and nursing note reviewed.  Constitutional:      Appearance: Normal appearance.  HENT:     Head: Atraumatic.     Mouth/Throat:     Comments: Patient able to open mouth fully, no trismus.  Tolerating secretions.  No obvious dental abscess.  Poor dentition throughout the mouth.  Neck soft and supple Cardiovascular:     Rate and Rhythm: Normal rate and regular rhythm.  Pulmonary:     Effort: Pulmonary effort is normal.  Genitourinary:    Comments: GU exam performed by Dr. Lynelle Doctor at request of patient for male examiner. Dr. Lynelle Doctor reports  "Scrotal area at base of penis small area of tenderness and induration, pea sized, no fluctuance, no ulceration.  No lymphangitic streaking." Neurological:     General: No focal deficit present.     Mental Status: He is alert.  Psychiatric:        Mood and Affect:  Mood normal.        Behavior: Behavior normal.     ED Results / Procedures / Treatments   Labs (all labs ordered are listed, but only abnormal results are displayed) Labs Reviewed  CBC WITH DIFFERENTIAL/PLATELET - Abnormal; Notable for the following components:      Result Value   WBC 12.3 (*)    Neutro Abs 8.1 (*)    Monocytes Absolute 1.1 (*)    All other components within normal limits  COMPREHENSIVE METABOLIC PANEL    EKG None  Radiology No results found.  Procedures Procedures    Medications Ordered in ED Medications  amoxicillin-clavulanate (AUGMENTIN) 875-125 MG per tablet 1 tablet (1 tablet Oral Given 05/08/23 1947)    ED Course/ Medical Decision Making/ A&P                                 Medical  Decision Making Amount and/or Complexity of Data Reviewed Labs: ordered.  Risk Prescription drug management.     Differential diagnosis includes but is not limited to dental infection, dental abscess, peritonsillar abscess, Ludwig angina, genital abscess, prostatitis, UTI  ED Course:  Patient overall well-appearing, no acute distress.  Vital signs stable.  He reports abscess to the scrotal area.  Requested male examiner for GU exam.  Dr. Lynelle Doctor examined patient and reported no obvious abscess but noted a small area at the base of the penis and scrotal area with tenderness and induration.  He recommended warm compresses for treatment and course of Augmentin.  Patient was given first dose of Augmentin here in the ER.  CBC with slight leukocytosis of 12.3.  CMP unremarkable.  Low concern for any prostatitis or UTI given no abdominal pain, dysuria, hematuria, increased frequency. Patient is not sexually active and denies any penile discharge, low suspicion for any STI at this time. Suspect patient's dental pain is secondary to his poor dentition.  No signs of dental abscess at this time.  No concern for Ludwig angina at this time. Patient appropriate for discharge home at this time.   Impression: Cellulitis at base of penis  Disposition:  The patient was discharged home with instructions to take full course of Augmentin.  Warm compresses to the scrotal area.  Establish care with dentist for management of condition and dental pain. Return precautions given.  Lab Tests: I Ordered, and personally interpreted labs.  The pertinent results include:   CBC with leukocytosis of 12.3 CMP unremarkable              Final Clinical Impression(s) / ED Diagnoses Final diagnoses:  Cellulitis of groin    Rx / DC Orders ED Discharge Orders          Ordered    amoxicillin-clavulanate (AUGMENTIN) 875-125 MG tablet  Every 12 hours        05/08/23 2001              Arabella Merles,  PA-C 05/08/23 1959    Arabella Merles, PA-C 05/08/23 2002    Linwood Dibbles, MD 05/09/23 [phone]

## 2023-05-08 NOTE — Discharge Instructions (Addendum)
Please apply warm compresses to the base of your penis/scrotal area for 5 to 10 minutes at a time 3-4 times a day.  Please keep the area clean by washing with soap and water daily.  You have been prescribed Augmentin. Take this antibiotic 2 times a day for the next 7 days. Take the full course of your antibiotic even if you start feeling better. Antibiotics may cause you to have diarrhea.  You are given your first dose here today.  Please continue taking this antibiotic starting tomorrow morning.  Please establish care with a dentist for further management of your dental pain.  You do not have any signs of a dental abscess or infection today.  You may take up to 1000mg  of tylenol every 6 hours as needed for pain. Do not take more then 4g per day.   You may use up to 800mg  ibuprofen every 8 hours as needed for pain.  Do not exceed 2.4g of ibuprofen per day.  Return the ER if the groin pain worsens, if you develop an abscess (a soft feeling area) as this would require drainage, if you have any fever, any other new or concerning symptoms.

## 2023-05-08 NOTE — ED Provider Notes (Signed)
I provided a substantive portion of the care of this patient.  I personally made/approved the management plan for this patient and take responsibility for the patient management.     Pt seen and examined.  Pt requested male examiner.  Scrotal area at base of penis small area of tenderness and induration, pea sized, no fluctuance, no ulceration.  No lymphangitic streaking.   Linwood Dibbles, MD 05/08/23 Rickey Primus

## 2023-05-08 NOTE — ED Triage Notes (Addendum)
Pt c/o increasing abscess/ingrown hair between penis and scrotum x3-4 days and R lower dental pain x2 weeks.  Pain score 8/10.  Pt reports taking OTC medication w/o relief.   Pt reports back, bottom teeth are rotten.

## 2023-07-19 ENCOUNTER — Ambulatory Visit: Attending: Cardiology | Admitting: Cardiology

## 2023-07-19 NOTE — Progress Notes (Deleted)
 Cardiology Office Note:  .   Date:  07/19/2023  ID:  Derek Little, DOB 05-28-1986, MRN [id number] PCP: [facility]  [facility] Providers Cardiologist:  Lynwood Schilling, MD { Click to update primary MD,subspecialty MD or APP then REFRESH:1}  History of Present Illness: Derek   BROC Little is a 38 y.o.   Discussed the use of AI scribe software for clinical note transcription with the patient, who gave verbal consent to proceed.  History of Present Illness            Labs   No results found for: CHOL, HDL, LDLCALC, LDLDIRECT, TRIG, CHOLHDL Lab Results  Component Value Date   NA 139 05/08/2023   K 3.5 05/08/2023   CO2 28 05/08/2023   GLUCOSE 97 05/08/2023   BUN 13 05/08/2023   CREATININE 1.02 05/08/2023   CALCIUM  9.2 05/08/2023   GFRNONAA >60 05/08/2023      Latest Ref Rng & Units 05/08/2023    6:26 PM 05/03/2023    9:14 AM 04/19/2021    2:14 PM  BMP  Glucose 70 - 99 mg/dL 97  896  96   BUN 6 - 20 mg/dL 13  6  10    Creatinine 0.61 - 1.24 mg/dL 8.97  9.30  9.20   Sodium 135 - 145 mmol/L 139  139  137   Potassium 3.5 - 5.1 mmol/L 3.5  3.2  3.8   Chloride 98 - 111 mmol/L 102  105  102   CO2 22 - 32 mmol/L 28  24  26    Calcium  8.9 - 10.3 mg/dL 9.2  8.9  9.3       Latest Ref Rng & Units 05/08/2023    6:26 PM 05/03/2023    9:14 AM 04/19/2021    2:14 PM  CBC  WBC 4.0 - 10.5 K/uL 12.3  10.0  11.9   Hemoglobin 13.0 - 17.0 g/dL 85.5  85.5  85.2   Hematocrit 39.0 - 52.0 % 42.4  42.0  43.0   Platelets 150 - 400 K/uL 231  254  255    External Labs:  Care Everywhere labs 10/05/2017:  Total cholesterol 224, triglycerides 122, HDL 41, LDL 159.  ***ROS  Physical Exam:   VS:  There were no vitals taken for this visit.   Wt Readings from Last 3 Encounters:  05/03/23 155 lb (70.3 kg)  08/13/18 204 lb 7 oz (92.7 kg)  02/19/18 175 lb (79.4 kg)     ***Physical Exam  Studies Reviewed: Derek    TEE 10/07/2017: LVEF 45 to 55% with apical  segment and anterior segment hypokinesis. No new valvular dysfunction, No evidence of aortic  dissection, No pericardial effusion and Findings reviewed with surgeon   Coronary angiogram 10/06/2017: 1. 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   2. Normal LVEDP of 14 mmHg   3. Mild LV Systolic dysfunction with estimated EF of 40-45% with severe  infero-apical and apical hypokinesis.   CABG 10/07/2017: LIMA to LAD and SVG to RCA    EKG:         ***  Medications and allergies    No Known Allergies   Current Outpatient Medications:    acetaminophen  (TYLENOL ) 325 MG tablet, Take 650 mg by mouth 3 (three) times daily as needed for moderate pain (pain score 4-6)., Disp: , Rfl:    alum & mag hydroxide-simeth (MAALOX/MYLANTA)  200-200-20 MG/5ML suspension, Take 30 mLs by mouth once., Disp: , Rfl:    atorvastatin  (LIPITOR) 20 MG tablet, Take 20 mg by mouth daily., Disp: , Rfl:    buPROPion (WELLBUTRIN XL) 300 MG 24 hr tablet, Take 300 mg by mouth daily., Disp: , Rfl:    hydrOXYzine  (ATARAX ) 25 MG tablet, Take 25 mg by mouth once., Disp: , Rfl:    loperamide  (IMODIUM  A-D) 2 MG tablet, Take 2 mg by mouth 3 (three) times daily as needed for diarrhea or loose stools., Disp: , Rfl:    meclizine (ANTIVERT) 25 MG tablet, Take 25 mg by mouth 3 (three) times daily as needed for dizziness., Disp: , Rfl:    ASSESSMENT AND PLAN: .      ICD-10-CM   1. Coronary artery disease of native artery of native heart with stable angina pectoris (HCC)  I25.118     2. Hypercholesteremia  E78.00     3. Polysubstance (including opioids) dependence, daily use (HCC)  F11.20    F19.20       1. Coronary artery disease of native artery of native heart with stable angina pectoris (HCC) ***  2. Hypercholesteremia ***  3. Polysubstance (including opioids) dependence, daily use [facility]) ***  Assessment and Plan                  Signed,  Gordy Bergamo, MD, [facility] 07/19/2023, 6:54 AM [facility] [address] [place], [place] [address] Phone: [phone]. Fax:  [phone]

## 2023-07-20 ENCOUNTER — Encounter: Payer: Self-pay | Admitting: Cardiology

## 2024-02-21 ENCOUNTER — Ambulatory Visit

## 2024-04-19 ENCOUNTER — Encounter: Payer: Self-pay | Admitting: Cardiology

## 2024-04-19 ENCOUNTER — Ambulatory Visit: Attending: Cardiology | Admitting: Cardiology
